# Patient Record
Sex: Male | Born: 1965 | Race: White | Hispanic: No | Smoking: Current every day smoker
Health system: Southern US, Community
[De-identification: ages and names within clinical notes are randomized; demographics above are authoritative.]

## PROBLEM LIST (undated history)

## (undated) DIAGNOSIS — E1139 Type 2 diabetes mellitus with other diabetic ophthalmic complication: Secondary | ICD-10-CM

## (undated) DIAGNOSIS — F1010 Alcohol abuse, uncomplicated: Secondary | ICD-10-CM

## (undated) DIAGNOSIS — H547 Unspecified visual loss: Secondary | ICD-10-CM

## (undated) DIAGNOSIS — E108 Type 1 diabetes mellitus with unspecified complications: Secondary | ICD-10-CM

## (undated) DIAGNOSIS — G629 Polyneuropathy, unspecified: Secondary | ICD-10-CM

## (undated) DIAGNOSIS — D72819 Decreased white blood cell count, unspecified: Secondary | ICD-10-CM

## (undated) DIAGNOSIS — M810 Age-related osteoporosis without current pathological fracture: Secondary | ICD-10-CM

## (undated) DIAGNOSIS — R531 Weakness: Secondary | ICD-10-CM

## (undated) HISTORY — PX: ORIF RADIAL HEAD / NECK FRACTURE: SUR956

---
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
## 2009-10-09 HISTORY — PX: EYE SURGERY: SHX253

## 2013-10-09 DIAGNOSIS — G629 Polyneuropathy, unspecified: Secondary | ICD-10-CM

## 2013-10-09 HISTORY — DX: Polyneuropathy, unspecified: G62.9

## 2015-09-02 ENCOUNTER — Encounter (HOSPITAL_BASED_OUTPATIENT_CLINIC_OR_DEPARTMENT_OTHER): Payer: Self-pay | Admitting: Emergency Medicine

## 2015-09-02 ENCOUNTER — Emergency Department (HOSPITAL_BASED_OUTPATIENT_CLINIC_OR_DEPARTMENT_OTHER): Payer: Self-pay

## 2015-09-02 ENCOUNTER — Emergency Department (HOSPITAL_BASED_OUTPATIENT_CLINIC_OR_DEPARTMENT_OTHER)
Admission: EM | Admit: 2015-09-02 | Discharge: 2015-09-02 | Disposition: A | Discharge status: Home / Self Care | Payer: Self-pay | Attending: Emergency Medicine | Admitting: Emergency Medicine

## 2015-09-02 DIAGNOSIS — Y9289 Other specified places as the place of occurrence of the external cause: Secondary | ICD-10-CM | POA: Insufficient documentation

## 2015-09-02 DIAGNOSIS — Z23 Encounter for immunization: Secondary | ICD-10-CM | POA: Insufficient documentation

## 2015-09-02 DIAGNOSIS — W228XXA Striking against or struck by other objects, initial encounter: Secondary | ICD-10-CM | POA: Insufficient documentation

## 2015-09-02 DIAGNOSIS — Z8659 Personal history of other mental and behavioral disorders: Secondary | ICD-10-CM | POA: Insufficient documentation

## 2015-09-02 DIAGNOSIS — F17200 Nicotine dependence, unspecified, uncomplicated: Secondary | ICD-10-CM | POA: Insufficient documentation

## 2015-09-02 DIAGNOSIS — Z794 Long term (current) use of insulin: Secondary | ICD-10-CM | POA: Insufficient documentation

## 2015-09-02 DIAGNOSIS — E119 Type 2 diabetes mellitus without complications: Secondary | ICD-10-CM | POA: Insufficient documentation

## 2015-09-02 DIAGNOSIS — Y9389 Activity, other specified: Secondary | ICD-10-CM | POA: Insufficient documentation

## 2015-09-02 DIAGNOSIS — S82122A Displaced fracture of lateral condyle of left tibia, initial encounter for closed fracture: Secondary | ICD-10-CM | POA: Insufficient documentation

## 2015-09-02 DIAGNOSIS — Y998 Other external cause status: Secondary | ICD-10-CM | POA: Insufficient documentation

## 2015-09-02 DIAGNOSIS — S82142A Displaced bicondylar fracture of left tibia, initial encounter for closed fracture: Secondary | ICD-10-CM

## 2015-09-02 HISTORY — DX: Alcohol abuse, uncomplicated: F10.10

## 2015-09-02 LAB — CBG MONITORING, ED
Glucose-Capillary: 18 mg/dL — CL (ref 65–99)
Glucose-Capillary: 160 mg/dL — ABNORMAL HIGH (ref 65–99)

## 2015-09-02 MED ORDER — TETANUS-DIPHTH-ACELL PERTUSSIS 5-2.5-18.5 LF-MCG/0.5 IM SUSP
0.5000 mL | Freq: Once | INTRAMUSCULAR | Status: AC
  Administered 2015-09-02: 0.5 mL via INTRAMUSCULAR
  Filled 2015-09-02: qty 0.5

## 2015-09-02 MED ORDER — OXYCODONE-ACETAMINOPHEN 5-325 MG PO TABS: 2.0000 | ORAL_TABLET | ORAL | Status: DC | PRN

## 2015-09-02 NOTE — ED Notes (Signed)
MD at bedside. 

## 2015-09-02 NOTE — ED Notes (Signed)
Pt requesting for tech to check bg, a/o x 4 and currently sitting up on side of bed in nad.  After check, requested for pt to turn off insulin pump, juice given and pt refused to do so.  States "itll be fine, i usually drop this low."  Informed of consequences of hypoglycemia, including death and pt still refusing.  Full meal offered and MD notified.  Will recheck.

## 2015-09-02 NOTE — ED Notes (Signed)
Patient stated he feels need to check blood glucose. I notified nurse Marva of request, i then took CBG and got result of 20 mg./dcltr. I notified nurse again, then rechecked with nurse present. I got 18 mg.;dcltr with second reading.

## 2015-09-02 NOTE — ED Notes (Signed)
During crutch training, I advised patient to use muscle in his hip to keep left leg out in front of him and thus avoid weight bearing. Patient stated he was unable to walk with crutches without bearing weight. Patient seemed unwilling to take my instruction. I notified patient of need for safety in crutch use.

## 2015-09-02 NOTE — Discharge Instructions (Signed)
° °Displaced Tibial Plateau Fracture °A tibial plateau fracture is a break in the bone that forms the bottom of your knee joint (tibia or shin bone). The lower end of your thigh bone (femur) forms the upper surface of your knee joint. The top of the tibia has a flat, smooth surface (tibial plateau). This part of the shin bone is made of softer bone than the shaft of the shin bone. If a strong force shoves your femur down into your tibial plateau, the tibial plateau can collapse or break away at the edges. This is also called an intra-articular fracture. °A displaced tibial plateau fracture means that one or more pieces of your tibial plateau have been moved out of normal position. Without treatment, this fracture can make your knee unstable. It can also lead to arthritis or difficulty walking. °CAUSES °Common causes of this type of fracture include: °· Car accidents. °· Jumps or falls from a significant height. °· Injuries from high-energy sports or contact sports. °RISK FACTORS °You may be at higher risk for this type of fracture if: °· You play high-energy sports or contact sports. °· You have a history of bone infections. °· You are an older person with a condition that causes weak bones (osteoporosis). °SIGNS AND SYMPTOMS °Signs and symptoms of a displaced tibial plateau fracture begin immediately after the injury. They may include: °· Pain that is worse when you use your knee to support your body weight. °· Swelling of your knee. °· Bruising around your knee. °Other symptoms may include: °· Your knee looking out of place (deformity). °· Your foot looking pale and feeling cool to the touch. °· Having a feeling of pins and needles around your foot. °DIAGNOSIS °A displaced tibial plateau fracture can be diagnosed with X-rays. Sometimes, a CT scan is done to help your health care provider: °· To identify how much the bone has moved out of place. °· To find any broken-off pieces of bone. °TREATMENT °Treatment for a  displaced tibial plateau fracture is surgery to put the pieces of broken bone back into place (open reduction). This surgery is usually done as soon as possible.  °While you are waiting for surgery, your health care provider may prescribe pain medicines. °HOME CARE INSTRUCTIONS °If you are sent home before surgery, follow these instructions: °Managing Pain, Stiffness, and Swelling °· If directed, apply ice to the injured area: °¨ Put ice in a plastic bag. °¨ Place a towel between your skin and the bag. °¨ Leave the ice on for 20 minutes, 2-3 times a day. °· Raise the injured area above the level of your heart while you are sitting or lying down. °Driving °· Do not drive or operate heavy machinery while you are taking pain medicine. °Activity °· Return to your normal activities as directed by your health care provider. Ask your health care provider what activities are safe for you. °· Perform range-of-motion exercises only as directed by your health care provider. °Safety °· Do not use your injured limb to support your body weight until your health care provider says that you can. Use crutches or a walker as directed by your health care provider. °General Instructions °· Do not use any tobacco products, including cigarettes, chewing tobacco, or electronic cigarettes. Tobacco can delay bone healing. If you need help quitting, ask your health care provider. °· Take medicines only as directed by your health care provider. °· Keep all follow-up visits as directed by your health care provider. This is   important. °SEEK MEDICAL CARE IF: °Any of these things happen while you are waiting for surgery: °· Your pain medicine is not helping. °· You develop chills or a fever. °SEEK IMMEDIATE MEDICAL CARE IF:  °Any of these things happen while you are waiting for surgery: °· You have severe pain or swelling. °· Your calf feels warm and is swollen and painful. °· You have chest pain. °· You have trouble breathing. °  °This  information is not intended to replace advice given to you by your health care provider. Make sure you discuss any questions you have with your health care provider. °  °Document Released: 06/17/2002 Document Revised: 10/16/2014 Document Reviewed: 05/13/2014 °Elsevier Interactive Patient Education ©2016 [employer] ° °

## 2015-09-02 NOTE — ED Notes (Signed)
Small abrasions on the back of head. Pt states "No I don't want it cleaned, just leave it I'm fine" RN discussed the importance of having it cleaned and his diabetic hx.

## 2015-09-02 NOTE — ED Provider Notes (Signed)
CSN: [id number]     Arrival date & time 09/02/15  1249 History   First MD Initiated Contact with Patient 09/02/15 1249     Chief Complaint  Patient presents with  . Fall      HPI  Patient complains of pain to his left knee and left lower leg. He was moving heavy containers off of a truck. He dropped 1. It landed to his left top of the box rolled back towards him and struck him just below his left knee with a lateral to medial valgus stress at his left knee.  He states he cannot walk. At rest he has minimal symptoms just and can move the knee. Have her weightbearing he has marked discomfort  Past Medical History  Diagnosis Date  . Diabetes mellitus without complication (HCC)   . ETOH abuse    No past surgical history on file. No family history on file. Social History  Substance Use Topics  . Smoking status: Current Every Day Smoker  . Smokeless tobacco: None  . Alcohol Use: Yes    Review of Systems  Musculoskeletal:       Pain to the left knee and lower leg. No other areas of pain or injury. No additional symptoms   review systems otherwise negative    Allergies  Review of patient's allergies indicates no known allergies.  Home Medications   Prior to Admission medications   Medication Sig Start Date End Date Taking? Authorizing Provider  insulin lispro (HUMALOG) 100 UNIT/ML injection Inject into the skin.   Yes Historical Provider, MD  oxyCODONE-acetaminophen (PERCOCET/ROXICET) 5-325 MG tablet Take 2 tablets by mouth every 4 (four) hours as needed. 09/02/15   Rolland PorterMark Jenesys Casseus, MD   BP 122/79 mmHg  Pulse 95  Temp(Src) 98.9 F (37.2 C) (Oral)  Resp 16  Wt 136 lb (61.689 kg)  SpO2 100% Physical Exam  Musculoskeletal:  No pain at the hip or ankle. He can flex the knee to approximately 90 although with some pain. Tenderness directly over the lateral joint line and fibular head. No palpable joint effusion.    ED Course  Procedures (including critical care time) Labs  Review Labs Reviewed - No data to display  Imaging Review Dg Tibia/fibula Left  09/02/2015  CLINICAL DATA:  Injury EXAM: LEFT TIBIA AND FIBULA - 2 VIEW COMPARISON:  None. FINDINGS: There is a lipohemarthrosis within the knee joint compatible with intra-articular fracture. There is a subtle fracture along the lateral lip of the tibial plateau worrisome for intra-articular fracture. Femur and patella are grossly intact. IMPRESSION: Findings are worrisome for an acute lateral tibial plateau fracture. CT of the knee is recommended. Electronically Signed   By: Jolaine ClickArthur  Hoss M.D.   On: 09/02/2015 13:11   Ct Knee Left Wo Contrast  09/02/2015  CLINICAL DATA:  Lateral knee pain, tenderness and limited weight-bearing after falling from a truck. Evaluate proximal tibial fracture. EXAM: CT OF THE LEFT KNEE WITHOUT CONTRAST TECHNIQUE: Multidetector CT imaging of the left knee was performed according to the standard protocol. Multiplanar CT image reconstructions were also generated. COMPARISON:  Radiograph same date. FINDINGS: CT confirms the presence of an acute fracture of the proximal tibia. There is mild depression of the articular surface of the lateral tibial plateau anteriorly by 2-3 mm. There is peripheral extension of a nondisplaced fracture into the anterior, lateral and posterior cortex of the lateral tibial metaphysis. There is also central extension of a nondisplaced fracture between the tibial spines. Well corticated 10  mm ossific density medial to the tibial insertion of the PCL appears well corticated, probably a chronic central loose body. The distal femur, proximal fibula and patella are intact. As evaluated by CT, the extensor mechanism and cruciate ligaments are intact. There is a moderate lipohemarthrosis. 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 IMPRESSION: 1. Intra-articular fracture of the proximal tibia as described with mild depression of the articular surface of the  lateral tibial plateau anteriorly. Fracture extends between the tibial spines which appear intact. 2. Lipohemarthrosis. 3. Suspected central loose body. Electronically Signed   By: Carey Bullocks M.D.   On: 09/02/2015 14:01   I have personally reviewed and evaluated these images and lab results as part of my medical decision-making.   EKG Interpretation None      MDM   Final diagnoses:  Tibial plateau fracture, left, closed, initial encounter    Mechanism symptoms and exam concerning for plateau fracture. Plain film x-rays show joint effusion and subtle abnormality adjacent to plateau. CT scan confirms minimally displaced lateral plateau fracture. Discussed at length with him. He just finished a meal. He is appropriate for outpatient treatment with orthopedic follow-up to discuss surgical versus nonsurgical treatment. Placed in a knee immobilizer and crutches. Instructed to be strictly nonweightbearing.    Rolland Porter, MD 09/02/15 [phone]

## 2015-09-02 NOTE — ED Notes (Signed)
Pt

## 2015-09-02 NOTE — ED Notes (Signed)
Pt given instructions about discharge and pain mgt and use of ice. Pt continues to say "Naw I don't need that."

## 2015-09-02 NOTE — ED Notes (Signed)
Patient transported to X-ray 

## 2015-09-06 LAB — CBG MONITORING, ED: Glucose-Capillary: 20 mg/dL — CL (ref 65–99)

## 2015-10-15 MED FILL — NovoLOG 100 UNIT/ML SOLN: 100 | 33 days supply | Qty: 10 | Fill #1

## 2015-10-27 MED FILL — CONTOUR NEXT STRIPS: 38 days supply | Qty: 150 | Fill #1

## 2015-11-16 MED FILL — NovoLOG 100 UNIT/ML SOLN: 100 | 33 days supply | Qty: 10 | Fill #2

## 2015-11-29 MED FILL — CONTOUR NEXT STRIPS: 38 days supply | Qty: 150 | Fill #2

## 2015-12-14 MED FILL — HumaLOG 100 UNIT/ML SOLN: 100 | 33 days supply | Qty: 10 | Fill #0

## 2016-01-05 MED FILL — HumaLOG 100 UNIT/ML SOLN: 100 | 33 days supply | Qty: 10 | Fill #1

## 2016-01-05 MED FILL — CONTOUR NEXT STRIPS: 38 days supply | Qty: 150 | Fill #3

## 2016-02-02 MED FILL — CONTOUR NEXT STRIPS: 38 days supply | Qty: 150 | Fill #4

## 2016-02-16 MED FILL — PENICILLIN VK 500 MG TABLET: 500 | 7 days supply | Qty: 29 | Fill #0

## 2016-02-16 MED FILL — CHLORHEXIDINE 0.12% RINSE: 0.12 | 16 days supply | Qty: 473 | Fill #0

## 2016-02-16 MED FILL — ACETAMINOPHEN/COD #3 TABLET: 300-30 | 3 days supply | Qty: 20 | Fill #0

## 2016-02-16 MED FILL — IBUPROFEN 600 MG TABLET: 600 | 5 days supply | Qty: 20 | Fill #0

## 2016-02-28 MED FILL — HumaLOG 100 UNIT/ML SOLN: 100 | 33 days supply | Qty: 10 | Fill #2

## 2016-03-27 MED FILL — HumaLOG 100 UNIT/ML SOLN: 100 | 33 days supply | Qty: 10 | Fill #3

## 2016-04-03 MED FILL — CONTOUR NEXT STRIPS: 38 days supply | Qty: 150 | Fill #0

## 2016-05-03 MED FILL — HumaLOG 100 UNIT/ML SOLN: 100 | 33 days supply | Qty: 10 | Fill #4

## 2016-05-04 MED FILL — CONTOUR NEXT STRIPS: 37 days supply | Qty: 150 | Fill #0

## 2016-05-26 MED FILL — CONTOUR NEXT STRIPS: 37 days supply | Qty: 150 | Fill #1

## 2016-06-13 MED FILL — HumaLOG 100 UNIT/ML SOLN: 100 | 33 days supply | Qty: 10 | Fill #5

## 2016-06-13 MED FILL — CONTOUR NEXT STRIPS: 37 days supply | Qty: 150 | Fill #2

## 2016-06-29 ENCOUNTER — Encounter (HOSPITAL_BASED_OUTPATIENT_CLINIC_OR_DEPARTMENT_OTHER): Payer: Self-pay | Admitting: Emergency Medicine

## 2016-06-29 ENCOUNTER — Emergency Department (HOSPITAL_BASED_OUTPATIENT_CLINIC_OR_DEPARTMENT_OTHER): Payer: Self-pay

## 2016-06-29 ENCOUNTER — Emergency Department (HOSPITAL_BASED_OUTPATIENT_CLINIC_OR_DEPARTMENT_OTHER)
Admission: EM | Admit: 2016-06-29 | Discharge: 2016-06-29 | Disposition: A | Discharge status: Home / Self Care | Payer: Self-pay | Attending: Emergency Medicine | Admitting: Emergency Medicine

## 2016-06-29 DIAGNOSIS — R7401 Elevation of levels of liver transaminase levels: Secondary | ICD-10-CM

## 2016-06-29 DIAGNOSIS — E119 Type 2 diabetes mellitus without complications: Secondary | ICD-10-CM | POA: Insufficient documentation

## 2016-06-29 DIAGNOSIS — R740 Nonspecific elevation of levels of transaminase and lactic acid dehydrogenase [LDH]: Secondary | ICD-10-CM | POA: Insufficient documentation

## 2016-06-29 DIAGNOSIS — F17210 Nicotine dependence, cigarettes, uncomplicated: Secondary | ICD-10-CM | POA: Insufficient documentation

## 2016-06-29 DIAGNOSIS — Z794 Long term (current) use of insulin: Secondary | ICD-10-CM | POA: Insufficient documentation

## 2016-06-29 DIAGNOSIS — J189 Pneumonia, unspecified organism: Secondary | ICD-10-CM | POA: Insufficient documentation

## 2016-06-29 LAB — CBC WITH DIFFERENTIAL/PLATELET
BASOS ABS: 0 10*3/uL (ref 0.0–0.1)
BASOS PCT: 0 %
EOS PCT: 2 %
Eosinophils Absolute: 0.1 10*3/uL (ref 0.0–0.7)
HCT: 33.3 % — ABNORMAL LOW (ref 39.0–52.0)
HEMOGLOBIN: 12 g/dL — AB (ref 13.0–17.0)
LYMPHS ABS: 0.4 10*3/uL — AB (ref 0.7–4.0)
LYMPHS PCT: 16 %
MCH: 33.1 pg (ref 26.0–34.0)
MCHC: 36 g/dL (ref 30.0–36.0)
MCV: 91.7 fL (ref 78.0–100.0)
MONOS PCT: 8 %
Monocytes Absolute: 0.2 10*3/uL (ref 0.1–1.0)
Neutro Abs: 1.9 10*3/uL (ref 1.7–7.7)
Neutrophils Relative %: 74 %
PLATELETS: 81 10*3/uL — AB (ref 150–400)
RBC: 3.63 MIL/uL — AB (ref 4.22–5.81)
RDW: 14.8 % (ref 11.5–15.5)
WBC: 2.6 10*3/uL — ABNORMAL LOW (ref 4.0–10.5)

## 2016-06-29 LAB — COMPREHENSIVE METABOLIC PANEL
ALK PHOS: 283 U/L — AB (ref 38–126)
ALT: 132 U/L — AB (ref 17–63)
AST: 397 U/L — AB (ref 15–41)
Albumin: 2.6 g/dL — ABNORMAL LOW (ref 3.5–5.0)
Anion gap: 10 (ref 5–15)
BUN: 5 mg/dL — AB (ref 6–20)
CALCIUM: 8.1 mg/dL — AB (ref 8.9–10.3)
CHLORIDE: 96 mmol/L — AB (ref 101–111)
CO2: 23 mmol/L (ref 22–32)
CREATININE: 0.6 mg/dL — AB (ref 0.61–1.24)
GFR calc non Af Amer: >60 mL/min (ref >60)
Glucose, Bld: 92 mg/dL (ref 65–99)
Potassium: 4.3 mmol/L (ref 3.5–5.1)
SODIUM: 129 mmol/L — AB (ref 135–145)
Total Bilirubin: 1.1 mg/dL (ref 0.3–1.2)
Total Protein: 6.1 g/dL — ABNORMAL LOW (ref 6.5–8.1)

## 2016-06-29 LAB — BRAIN NATRIURETIC PEPTIDE: B Natriuretic Peptide: 66.4 pg/mL (ref 0.0–100.0)

## 2016-06-29 LAB — LIPASE, BLOOD: LIPASE: 32 U/L (ref 11–51)

## 2016-06-29 LAB — D-DIMER, QUANTITATIVE (NOT AT ARMC): D DIMER QUANT: 1.65 ug{FEU}/mL — AB (ref 0.00–0.50)

## 2016-06-29 LAB — TROPONIN I: TROPONIN I: 0.03 ng/mL — AB (ref <0.03)

## 2016-06-29 MED ORDER — IOPAMIDOL (ISOVUE-370) INJECTION 76%
100.0000 mL | Freq: Once | INTRAVENOUS | Status: AC | PRN
  Administered 2016-06-29: 100 mL via INTRAVENOUS

## 2016-06-29 MED ORDER — DEXTROSE 5 % IV SOLN
1.0000 g | Freq: Once | INTRAVENOUS | Status: AC
  Administered 2016-06-29: 1 g via INTRAVENOUS
  Filled 2016-06-29: qty 10

## 2016-06-29 MED ORDER — DOXYCYCLINE HYCLATE 100 MG PO CAPS: 100.0000 mg | ORAL_CAPSULE | Freq: Two times a day (BID) | ORAL | 0 refills | Status: DC

## 2016-06-29 MED ORDER — AZITHROMYCIN 250 MG PO TABS: 500.0000 mg | ORAL_TABLET | Freq: Once | ORAL | Status: DC

## 2016-06-29 MED ORDER — AMOXICILLIN-POT CLAVULANATE ER 1000-62.5 MG PO TB12: 2.0000 | ORAL_TABLET | Freq: Two times a day (BID) | ORAL | 0 refills | Status: DC

## 2016-06-29 MED ORDER — IPRATROPIUM-ALBUTEROL 0.5-2.5 (3) MG/3ML IN SOLN
3.0000 mL | Freq: Once | RESPIRATORY_TRACT | Status: AC
  Administered 2016-06-29: 3 mL via RESPIRATORY_TRACT
  Filled 2016-06-29: qty 3

## 2016-06-29 MED FILL — CONTOUR NEXT STRIPS: 38 days supply | Qty: 150 | Fill #3

## 2016-06-29 MED FILL — AMOXICILLIN-CLAV ER 1,000-6: 1000-62.5 | 7 days supply | Qty: 28 | Fill #0

## 2016-06-29 MED FILL — DOXYCYCLINE HYC 100 MG CAP: 100 | 7 days supply | Qty: 14 | Fill #0

## 2016-06-29 NOTE — ED Notes (Signed)
Pt returns from radiology, placed back on cont cardiac monitoring with cont POX, pt stood to void (voided 26400ml's into urinal), became very SOB, resp increased to 32/min, HR increased to 140/min, placed back on stretcher, positioned for comfort, semi-fowlers position, POX 100% on RA, HR now at 106/min, RR at 28/min

## 2016-06-29 NOTE — ED Triage Notes (Signed)
Pt presents with sob and weakness for a month but reports it has gotten worse Monday. Denies chest pain or fever but n/v/d at least 1-2 times daily. VSS at triage and ambulatory.

## 2016-06-29 NOTE — ED Provider Notes (Signed)
[facility]   CSN: [id number] Arrival date & time: 06/29/16  1120     History   Chief Complaint Chief Complaint  Patient presents with  . Shortness of Breath  . Weakness    HPI Jorge Baker is a 50 y.o. male.  The history is provided by the patient. No language interpreter was used.  Shortness of Breath   Weakness  Associated symptoms include shortness of breath.    Jorge Baker is a 50 y.o. male who presents to the Emergency Department complaining of SOB.  He reports 1 month of progressive shortness of breath and dyspnea on exertion. He has associated lower extremity weakness. He has a cough productive of sputum. Symptoms are moderate to severe, constant, worsening. He denies any chest pain, fevers, leg swelling or pain. He has a history of insulin-dependent diabetes since the age of 2-1/2. He denies any history of cardiac disease or blood clots. He drinks a sixpack of beer daily if his blood sugars will allow him to. Last November he developed a rash to his face, chest, back that is itchy in nature. He has not been evaluated for this rash is gradually worsening.  Past Medical History:  Diagnosis Date  . Diabetes mellitus without complication (HCC)   . ETOH abuse     There are no active problems to display for this patient.   Past Surgical History:  Procedure Laterality Date  . EYE SURGERY         Home Medications    Prior to Admission medications   Medication Sig Start Date End Date Taking? Authorizing Provider  insulin lispro (HUMALOG) 100 UNIT/ML injection Inject into the skin.   Yes Historical Provider, MD  amoxicillin-clavulanate (AUGMENTIN XR) 1000-62.5 MG 12 hr tablet Take 2 tablets by mouth 2 (two) times daily. 06/29/16   Tilden Fossa, MD  doxycycline (VIBRAMYCIN) 100 MG capsule Take 1 capsule (100 mg total) by mouth 2 (two) times daily. 06/29/16   Tilden Fossa, MD  oxyCODONE-acetaminophen (PERCOCET/ROXICET) 5-325 MG  tablet Take 2 tablets by mouth every 4 (four) hours as needed. 09/02/15   Rolland Porter, MD    Family History No family history on file.  Social History Social History  Substance Use Topics  . Smoking status: Current Every Day Smoker    Packs/day: 1.00    Types: Cigarettes  . Smokeless tobacco: Never Used  . Alcohol use Yes     Allergies   Review of patient's allergies indicates no known allergies.   Review of Systems Review of Systems  Respiratory: Positive for shortness of breath.   Neurological: Positive for weakness.  All other systems reviewed and are negative.    Physical Exam Updated Vital Signs BP 103/64   Pulse 106   Temp 98.9 F (37.2 C) (Oral)   Resp (!) 27   Ht 5\' 4"  (1.626 m)   Wt 124 lb (56.2 kg)   SpO2 96%   BMI 21.28 kg/m   Physical Exam  Constitutional: He is oriented to person, place, and time. He appears well-developed.  Chronically ill-appearing  HENT:  Head: Normocephalic and atraumatic.  Cardiovascular: Regular rhythm.   No murmur heard. Tachycardic  Pulmonary/Chest: Effort normal. No respiratory distress.  Bibasilar crackles, greatest in the right lung base.  Abdominal: Soft. There is no rebound and no guarding.  Insulin pump in the abdominal wall. Mild epigastric tenderness  Musculoskeletal: He exhibits no tenderness.  1+ pitting edema to bilateral lower extremities  Neurological: He is  alert and oriented to person, place, and time.  Skin: Skin is warm and dry.  Erythematous rash to head, neck, upper back with white crusting and scaling  Psychiatric: He has a normal mood and affect. His behavior is normal.  Nursing note and vitals reviewed.    ED Treatments / Results  Labs (all labs ordered are listed, but only abnormal results are displayed) Labs Reviewed  COMPREHENSIVE METABOLIC PANEL - Abnormal; Notable for the following:       Result Value   Sodium 129 (*)    Chloride 96 (*)    BUN 5 (*)    Creatinine, Ser 0.60 (*)     Calcium 8.1 (*)    Total Protein 6.1 (*)    Albumin 2.6 (*)    AST 397 (*)    ALT 132 (*)    Alkaline Phosphatase 283 (*)    All other components within normal limits  TROPONIN I - Abnormal; Notable for the following:    Troponin I 0.03 (*)    All other components within normal limits  CBC WITH DIFFERENTIAL/PLATELET - Abnormal; Notable for the following:    WBC 2.6 (*)    RBC 3.63 (*)    Hemoglobin 12.0 (*)    HCT 33.3 (*)    Platelets 81 (*)    Lymphs Abs 0.4 (*)    All other components within normal limits  D-DIMER, QUANTITATIVE (NOT AT [facility]) - Abnormal; Notable for the following:    D-Dimer, Quant 1.65 (*)    All other components within normal limits  BRAIN NATRIURETIC PEPTIDE  LIPASE, BLOOD    EKG  EKG Interpretation  Date/Time:  Thursday June 29 2016 12:31:08 EDT Ventricular Rate:  95 PR Interval:    QRS Duration: 80 QT Interval:  324 QTC Calculation: 408 R Axis:   82 Text Interpretation:  Sinus rhythm Probable left atrial enlargement Low voltage with right axis deviation ST elev, probable normal early repol pattern Baseline wander Confirmed by Lincoln Brighamees, Liz [phone]([id number]) on 06/29/2016 12:57:09 PM       Radiology Dg Chest 2 View  Result Date: 06/29/2016 CLINICAL DATA:  Cough and dyspnea for 1 month EXAM: CHEST  2 VIEW COMPARISON:  None. FINDINGS: The heart size and mediastinal contours are within normal limits. The lungs are hyperinflated without evidence of CHF or pneumonic consolidation. There is attenuation of interstitial and pulmonary vasculature. The visualized skeletal structures are unremarkable. IMPRESSION: No active cardiopulmonary disease.  Hyperinflated lungs. Electronically Signed   By: Tollie Ethavid  Kwon M.D.   On: 06/29/2016 12:25   Ct Angio Chest Pe W/cm &/or Wo Cm  Result Date: 06/29/2016 CLINICAL DATA:  Intermittent shortness of breath for 1 month, constant for the past 3 days. EXAM: CT ANGIOGRAPHY CHEST WITH CONTRAST TECHNIQUE: Multidetector CT imaging of the  chest was performed using the standard protocol during bolus administration of intravenous contrast. Multiplanar CT image reconstructions and MIPs were obtained to evaluate the vascular anatomy. CONTRAST:  100 mL Isovue 370 COMPARISON:  Chest radiographs 06/29/2016 FINDINGS: Cardiovascular: Satisfactory opacification of the pulmonary arteries to the segmental level. No evidence of pulmonary embolism. Normal heart size. No pericardial effusion. Normal caliber of the thoracic aorta. Mediastinum/Nodes: Subcarinal lymph nodes measure up to 1.3 cm in short axis, nonspecific. No enlarged axillary or hilar lymph nodes. Unremarkable thyroid. Lungs/Pleura: No pleural effusion or pneumothorax. Evaluation of the lung parenchyma is mildly limited by respiratory motion artifact. Minimal subpleural opacities dependently in both lower lobes likely reflect atelectasis. Additionally, there  is asymmetric patchy opacity in the basilar right lower lobe. Faint subpleural ground-glass nodular opacities are also suspected in the upper lobes, left greater than right an with some areas suggestive of tree-in-bud configuration. (For example series 8, image 76). Upper Abdomen: Diffusely decreased attenuation of the liver consistent with steatosis. Musculoskeletal: Thoracic spondylosis with scattered Schmorl's nodes. Review of the MIP images confirms the above findings. IMPRESSION: 1. No evidence of pulmonary emboli. 2. Mild right lower lobe opacity which may reflect atelectasis or pneumonia. 3. Faint peripheral ground-glass nodularity which may reflect bronchiolitis. Evaluation limited by motion artifact. Electronically Signed   By: Sebastian Ache M.D.   On: 06/29/2016 15:30   US Abdomen Limited Ruq  Result Date: 06/29/2016 CLINICAL DATA:  Right upper quadrant pain, increased transaminases EXAM: US ABDOMEN LIMITED - RIGHT UPPER QUADRANT COMPARISON:  None. FINDINGS: Gallbladder: No gallstones or wall thickening visualized. No sonographic  Murphy sign noted by sonographer. Common bile duct: Diameter: 4.2 mm in diameter within normal limits. Liver: No focal lesion identified. There is diffuse increased echogenicity of the liver consistent with fatty infiltration. IMPRESSION: No gallstones are noted within gallbladder. Normal CBD. Diffuse increased echogenicity of the liver consistent with fatty infiltration. Electronically Signed   By: Natasha Mead M.D.   On: 06/29/2016 15:41    Procedures Procedures (including critical care time)  Medications Ordered in ED Medications  ipratropium-albuterol (DUONEB) 0.5-2.5 (3) MG/3ML nebulizer solution 3 mL (3 mLs Nebulization Given 06/29/16 1403)  iopamidol (ISOVUE-370) 76 % injection 100 mL (100 mLs Intravenous Contrast Given 06/29/16 1454)  cefTRIAXone (ROCEPHIN) 1 g in dextrose 5 % 50 mL IVPB (0 g Intravenous Stopped 06/29/16 1736)     Initial Impression / Assessment and Plan / ED Course  I have reviewed the triage vital signs and the nursing notes.  Pertinent labs & imaging results that were available during my care of the patient were reviewed by me and considered in my medical decision making (see chart for details).  Clinical Course    Patient with history of diabetes and alcohol abuse here for progressive weakness and cough. He is chronically ill appearing on examination. Initial examination concerning for congestive heart failure versus pneumonia. Initial chest x-ray is clear. CT PE study was obtained given his symptoms and elevated d-dimer. Study was negative for PE but did demonstrate a right lower lobe pneumonia that is consistent with his examination. He was treated with antibiotics with recommendation for admission given his tachycardia, tachypnea and multiple comorbidities. Patient refused admission to the hospital. Discussed with patient concern for worsening illness as well as worsening respiratory status, potential injury to his heart and lungs. Patient understood the  recommendations and continues to refuse admission. His mother was present for conversation. Plan to administer antibiotics prior to discharge and will discharge home on Augmentin and doxycycline for community acquired pneumonia. Also discussed with patient multiple lab abnormalities that will need prompt follow-up.  Final Clinical Impressions(s) / ED Diagnoses   Final diagnoses:  Transaminitis  CAP (community acquired pneumonia)    New Prescriptions Discharge Medication List as of 06/29/2016  4:28 PM    START taking these medications   Details  amoxicillin-clavulanate (AUGMENTIN XR) 1000-62.5 MG 12 hr tablet Take 2 tablets by mouth 2 (two) times daily., Starting Thu 06/29/2016, Print    doxycycline (VIBRAMYCIN) 100 MG capsule Take 1 capsule (100 mg total) by mouth 2 (two) times daily., Starting Thu 06/29/2016, Print         Tilden Fossa, MD 06/30/16  0704  

## 2016-06-29 NOTE — ED Notes (Signed)
Pt states has had SOB with exertion or doing activity for the past month, pt states has become worse in the past week, states feels better after sitting down and resting for approx 10 min, denies any PND or orthopnea. States having prod cough, white in color. Denies any chest pain or any swelling to be noted with episodes. States has had a poor appetite as well.

## 2016-06-29 NOTE — Discharge Instructions (Signed)
You have multiple issues that need to be followed up closely by your family doctor.  Your heart enzyme and liver enzymes were elevated.  Your sodium and blood counts are low.  Try to decrease how much alcohol you are drinking, it is harming your liver and possibly your heart and blood cells.  Get rechecked immediately.

## 2016-06-29 NOTE — ED Notes (Signed)
Rocephin completed. Pt states he is ready to leave. Family at bedside to drive.

## 2016-06-29 NOTE — ED Notes (Signed)
CXR completed as ordered, pt placed on cont cardiac monitoring with int NBP monitoring and cont POX montioring. Safety measures in place. Plan of care explained to patient

## 2016-06-29 NOTE — ED Notes (Signed)
Pt off monitor to radiology/ CT scan, pt on stretcher, sr x 4 up

## 2016-06-29 NOTE — ED Notes (Signed)
Family at bedside. 

## 2016-06-29 NOTE — ED Notes (Signed)
Patient transported to X-ray 

## 2016-06-29 NOTE — ED Notes (Signed)
Insulin pump present at triage. Pt asked to get into gown for examination.

## 2016-07-17 MED FILL — HumaLOG 100 UNIT/ML SOLN: 100 | 33 days supply | Qty: 10 | Fill #0

## 2016-07-17 MED FILL — CONTOUR NEXT STRIPS: 38 days supply | Qty: 150 | Fill #4

## 2016-08-03 MED FILL — CONTOUR NEXT STRIPS: 38 days supply | Qty: 150 | Fill #5

## 2016-08-09 DIAGNOSIS — D72819 Decreased white blood cell count, unspecified: Secondary | ICD-10-CM

## 2016-08-09 HISTORY — DX: Decreased white blood cell count, unspecified: D72.819

## 2016-08-14 MED FILL — IVERMECTIN 3 MG TABLET: 3 | 7 days supply | Qty: 8 | Fill #0

## 2016-08-22 MED FILL — HumaLOG 100 UNIT/ML SOLN: 100 | 34 days supply | Qty: 10 | Fill #0

## 2016-08-22 MED FILL — CONTOUR NEXT STRIPS: 38 days supply | Qty: 150 | Fill #0

## 2016-08-29 MED FILL — metroNIDAZOLE 500 MG TABS: 500 | 10 days supply | Qty: 30 | Fill #0

## 2016-08-30 MED FILL — NovoLOG 100 UNIT/ML SOLN: 100 | 36 days supply | Qty: 10 | Fill #0

## 2016-09-22 MED FILL — CONTOUR NEXT STRIPS: 38 days supply | Qty: 150 | Fill #1

## 2016-10-04 MED FILL — TRIAMCINOLONE 0.1% OINTMENT: 0.1 | 30 days supply | Qty: 80 | Fill #0

## 2016-10-19 MED FILL — CONTOUR NEXT STRIPS: 38 days supply | Qty: 150 | Fill #2

## 2016-11-09 MED FILL — ACCU-CHEK GUIDE TEST STRIP: 50 days supply | Qty: 200 | Fill #0

## 2016-11-09 MED FILL — ACCU-CHEK FASTCLIX LANCETS: 51 days supply | Qty: 204 | Fill #0

## 2016-11-09 MED FILL — ACCU-CHEK GUIDE MONITOR SYS: W/DEVICE | 30 days supply | Qty: 1 | Fill #0

## 2016-11-12 ENCOUNTER — Inpatient Hospital Stay (HOSPITAL_BASED_OUTPATIENT_CLINIC_OR_DEPARTMENT_OTHER)
Admission: EM | Admit: 2016-11-12 | Discharge: 2016-11-22 | DRG: 812 | Disposition: A | Discharge status: Skilled Nursing Facility | Payer: Medicare HMO | Attending: Nephrology | Admitting: Nephrology

## 2016-11-12 ENCOUNTER — Emergency Department (HOSPITAL_BASED_OUTPATIENT_CLINIC_OR_DEPARTMENT_OTHER): Payer: Medicare HMO

## 2016-11-12 ENCOUNTER — Observation Stay (HOSPITAL_COMMUNITY)
Admission: AD | Admit: 2016-11-12 | Payer: Self-pay | Source: Other Acute Inpatient Hospital | Admitting: Family Medicine

## 2016-11-12 ENCOUNTER — Encounter (HOSPITAL_BASED_OUTPATIENT_CLINIC_OR_DEPARTMENT_OTHER): Payer: Self-pay | Admitting: Emergency Medicine

## 2016-11-12 DIAGNOSIS — K921 Melena: Secondary | ICD-10-CM | POA: Diagnosis present

## 2016-11-12 DIAGNOSIS — K649 Unspecified hemorrhoids: Secondary | ICD-10-CM | POA: Diagnosis present

## 2016-11-12 DIAGNOSIS — K648 Other hemorrhoids: Secondary | ICD-10-CM | POA: Diagnosis present

## 2016-11-12 DIAGNOSIS — E876 Hypokalemia: Secondary | ICD-10-CM | POA: Diagnosis not present

## 2016-11-12 DIAGNOSIS — E7220 Disorder of urea cycle metabolism, unspecified: Secondary | ICD-10-CM | POA: Diagnosis present

## 2016-11-12 DIAGNOSIS — D6959 Other secondary thrombocytopenia: Secondary | ICD-10-CM | POA: Diagnosis present

## 2016-11-12 DIAGNOSIS — M25561 Pain in right knee: Secondary | ICD-10-CM | POA: Diagnosis present

## 2016-11-12 DIAGNOSIS — D696 Thrombocytopenia, unspecified: Secondary | ICD-10-CM | POA: Diagnosis present

## 2016-11-12 DIAGNOSIS — R531 Weakness: Secondary | ICD-10-CM | POA: Diagnosis not present

## 2016-11-12 DIAGNOSIS — E118 Type 2 diabetes mellitus with unspecified complications: Secondary | ICD-10-CM

## 2016-11-12 DIAGNOSIS — K2970 Gastritis, unspecified, without bleeding: Secondary | ICD-10-CM | POA: Diagnosis present

## 2016-11-12 DIAGNOSIS — M810 Age-related osteoporosis without current pathological fracture: Secondary | ICD-10-CM | POA: Diagnosis present

## 2016-11-12 DIAGNOSIS — E10649 Type 1 diabetes mellitus with hypoglycemia without coma: Secondary | ICD-10-CM | POA: Diagnosis present

## 2016-11-12 DIAGNOSIS — D62 Acute posthemorrhagic anemia: Secondary | ICD-10-CM | POA: Diagnosis not present

## 2016-11-12 DIAGNOSIS — K922 Gastrointestinal hemorrhage, unspecified: Secondary | ICD-10-CM | POA: Diagnosis present

## 2016-11-12 DIAGNOSIS — Z794 Long term (current) use of insulin: Secondary | ICD-10-CM

## 2016-11-12 DIAGNOSIS — F17210 Nicotine dependence, cigarettes, uncomplicated: Secondary | ICD-10-CM | POA: Diagnosis present

## 2016-11-12 DIAGNOSIS — F10239 Alcohol dependence with withdrawal, unspecified: Secondary | ICD-10-CM | POA: Diagnosis not present

## 2016-11-12 DIAGNOSIS — D649 Anemia, unspecified: Secondary | ICD-10-CM | POA: Diagnosis not present

## 2016-11-12 DIAGNOSIS — E1069 Type 1 diabetes mellitus with other specified complication: Secondary | ICD-10-CM | POA: Diagnosis present

## 2016-11-12 DIAGNOSIS — R000 Tachycardia, unspecified: Secondary | ICD-10-CM

## 2016-11-12 DIAGNOSIS — F1010 Alcohol abuse, uncomplicated: Secondary | ICD-10-CM | POA: Diagnosis present

## 2016-11-12 DIAGNOSIS — E1065 Type 1 diabetes mellitus with hyperglycemia: Secondary | ICD-10-CM | POA: Diagnosis not present

## 2016-11-12 DIAGNOSIS — K644 Residual hemorrhoidal skin tags: Secondary | ICD-10-CM | POA: Diagnosis present

## 2016-11-12 DIAGNOSIS — N5089 Other specified disorders of the male genital organs: Secondary | ICD-10-CM | POA: Clinically undetermined

## 2016-11-12 DIAGNOSIS — M25569 Pain in unspecified knee: Secondary | ICD-10-CM

## 2016-11-12 DIAGNOSIS — K219 Gastro-esophageal reflux disease without esophagitis: Secondary | ICD-10-CM | POA: Diagnosis present

## 2016-11-12 DIAGNOSIS — H539 Unspecified visual disturbance: Secondary | ICD-10-CM | POA: Diagnosis present

## 2016-11-12 DIAGNOSIS — K7011 Alcoholic hepatitis with ascites: Secondary | ICD-10-CM | POA: Diagnosis present

## 2016-11-12 DIAGNOSIS — E119 Type 2 diabetes mellitus without complications: Secondary | ICD-10-CM

## 2016-11-12 DIAGNOSIS — R140 Abdominal distension (gaseous): Secondary | ICD-10-CM

## 2016-11-12 DIAGNOSIS — K2990 Gastroduodenitis, unspecified, without bleeding: Secondary | ICD-10-CM

## 2016-11-12 DIAGNOSIS — F329 Major depressive disorder, single episode, unspecified: Secondary | ICD-10-CM | POA: Diagnosis present

## 2016-11-12 HISTORY — DX: Unspecified visual loss: H54.7

## 2016-11-12 HISTORY — DX: Age-related osteoporosis without current pathological fracture: M81.0

## 2016-11-12 HISTORY — DX: Type 1 diabetes mellitus with unspecified complications: E10.8

## 2016-11-12 HISTORY — DX: Polyneuropathy, unspecified: G62.9

## 2016-11-12 HISTORY — DX: Decreased white blood cell count, unspecified: D72.819

## 2016-11-12 HISTORY — DX: Type 2 diabetes mellitus with other diabetic ophthalmic complication: E11.39

## 2016-11-12 LAB — COMPREHENSIVE METABOLIC PANEL
ALT: 68 U/L — ABNORMAL HIGH (ref 17–63)
ANION GAP: 15 (ref 5–15)
AST: 181 U/L — AB (ref 15–41)
Albumin: 2.2 g/dL — ABNORMAL LOW (ref 3.5–5.0)
Alkaline Phosphatase: 306 U/L — ABNORMAL HIGH (ref 38–126)
BUN: 20 mg/dL (ref 6–20)
CHLORIDE: 99 mmol/L — AB (ref 101–111)
CO2: 19 mmol/L — ABNORMAL LOW (ref 22–32)
Calcium: 7.9 mg/dL — ABNORMAL LOW (ref 8.9–10.3)
Creatinine, Ser: 0.53 mg/dL — ABNORMAL LOW (ref 0.61–1.24)
GFR calc Af Amer: >60 mL/min (ref >60)
Glucose, Bld: 91 mg/dL (ref 65–99)
POTASSIUM: 4.3 mmol/L (ref 3.5–5.1)
Sodium: 133 mmol/L — ABNORMAL LOW (ref 135–145)
TOTAL PROTEIN: 5.6 g/dL — AB (ref 6.5–8.1)
Total Bilirubin: 1 mg/dL (ref 0.3–1.2)

## 2016-11-12 LAB — RETICULOCYTES
RBC.: 2.27 MIL/uL — AB (ref 4.22–5.81)
RETIC COUNT ABSOLUTE: 43.1 10*3/uL (ref 19.0–186.0)
RETIC CT PCT: 1.9 % (ref 0.4–3.1)

## 2016-11-12 LAB — GLUCOSE, CAPILLARY: GLUCOSE-CAPILLARY: 135 mg/dL — AB (ref 65–99)

## 2016-11-12 LAB — URINALYSIS, ROUTINE W REFLEX MICROSCOPIC
Bilirubin Urine: NEGATIVE
Glucose, UA: NEGATIVE mg/dL
KETONES UR: 15 mg/dL — AB
LEUKOCYTES UA: NEGATIVE
NITRITE: NEGATIVE
PROTEIN: NEGATIVE mg/dL
Specific Gravity, Urine: 1.015 (ref 1.005–1.030)
pH: 6 (ref 5.0–8.0)

## 2016-11-12 LAB — IRON AND TIBC
Iron: 145 ug/dL (ref 45–182)
Saturation Ratios: 86 % — ABNORMAL HIGH (ref 17.9–39.5)
TIBC: 168 ug/dL — ABNORMAL LOW (ref 250–450)
UIBC: 23 ug/dL

## 2016-11-12 LAB — URINALYSIS, MICROSCOPIC (REFLEX)

## 2016-11-12 LAB — CBC WITH DIFFERENTIAL/PLATELET
BASOS PCT: 1 %
Basophils Absolute: 0.1 10*3/uL (ref 0.0–0.1)
EOS ABS: 0.1 10*3/uL (ref 0.0–0.7)
Eosinophils Relative: 1 %
HCT: 20.2 % — ABNORMAL LOW (ref 39.0–52.0)
Hemoglobin: 7 g/dL — ABNORMAL LOW (ref 13.0–17.0)
Lymphocytes Relative: 7 %
Lymphs Abs: 0.5 10*3/uL — ABNORMAL LOW (ref 0.7–4.0)
MCH: 31.4 pg (ref 26.0–34.0)
MCHC: 34.7 g/dL (ref 30.0–36.0)
MCV: 90.6 fL (ref 78.0–100.0)
MONO ABS: 0.6 10*3/uL (ref 0.1–1.0)
Monocytes Relative: 8 %
NEUTROS PCT: 83 %
Neutro Abs: 6.1 10*3/uL (ref 1.7–7.7)
Platelets: 73 10*3/uL — ABNORMAL LOW (ref 150–400)
RBC: 2.23 MIL/uL — ABNORMAL LOW (ref 4.22–5.81)
RDW: 16.7 % — AB (ref 11.5–15.5)
WBC: 7.4 10*3/uL (ref 4.0–10.5)

## 2016-11-12 LAB — LIPASE, BLOOD: LIPASE: 26 U/L (ref 11–51)

## 2016-11-12 LAB — OCCULT BLOOD X 1 CARD TO LAB, STOOL: FECAL OCCULT BLD: POSITIVE — AB

## 2016-11-12 LAB — VITAMIN B12: VITAMIN B 12: 696 pg/mL (ref 180–914)

## 2016-11-12 LAB — CBG MONITORING, ED
GLUCOSE-CAPILLARY: 83 mg/dL (ref 65–99)
GLUCOSE-CAPILLARY: 204 mg/dL — AB (ref 65–99)
Glucose-Capillary: 49 mg/dL — ABNORMAL LOW (ref 65–99)

## 2016-11-12 LAB — PREPARE RBC (CROSSMATCH)

## 2016-11-12 LAB — FOLATE: FOLATE: 1.7 ng/mL — AB (ref >5.9)

## 2016-11-12 LAB — PROTIME-INR
INR: 1.12
PROTHROMBIN TIME: 14.5 s (ref 11.4–15.2)

## 2016-11-12 LAB — FERRITIN: Ferritin: 1611 ng/mL — ABNORMAL HIGH (ref 24–336)

## 2016-11-12 MED ORDER — DEXTROSE 50 % IV SOLN
INTRAVENOUS | Status: AC
Start: 2016-11-12 — End: 2016-11-12
  Administered 2016-11-12: 50 mL via INTRAVENOUS
  Filled 2016-11-12: qty 50

## 2016-11-12 MED ORDER — ONDANSETRON HCL 4 MG/2ML IJ SOLN
4.0000 mg | Freq: Once | INTRAMUSCULAR | Status: AC
  Administered 2016-11-12: 4 mg via INTRAVENOUS
  Filled 2016-11-12: qty 2

## 2016-11-12 MED ORDER — ACETAMINOPHEN 325 MG PO TABS
650.0000 mg | ORAL_TABLET | Freq: Four times a day (QID) | ORAL | Status: DC | PRN
  Administered 2016-11-12: 650 mg via ORAL
  Filled 2016-11-12 (×2): qty 2

## 2016-11-12 MED ORDER — OCTREOTIDE ACETATE 500 MCG/ML IJ SOLN
50.0000 ug/h | INTRAMUSCULAR | Status: DC
  Administered 2016-11-13 – 2016-11-14 (×3): 50 ug/h via INTRAVENOUS
  Filled 2016-11-12 (×6): qty 1

## 2016-11-12 MED ORDER — FOLIC ACID 1 MG PO TABS
1.0000 mg | ORAL_TABLET | Freq: Every day | ORAL | Status: DC
  Administered 2016-11-13 – 2016-11-20 (×6): 1 mg via ORAL
  Filled 2016-11-12 (×9): qty 1

## 2016-11-12 MED ORDER — THIAMINE HCL 100 MG/ML IJ SOLN
100.0000 mg | Freq: Every day | INTRAMUSCULAR | Status: DC
  Administered 2016-11-14: 100 mg via INTRAVENOUS
  Filled 2016-11-12 (×2): qty 2

## 2016-11-12 MED ORDER — INSULIN GLARGINE 100 UNIT/ML ~~LOC~~ SOLN
12.0000 [IU] | Freq: Every day | SUBCUTANEOUS | Status: DC
  Administered 2016-11-12: 12 [IU] via SUBCUTANEOUS
  Filled 2016-11-12: qty 0.12

## 2016-11-12 MED ORDER — SODIUM CHLORIDE 0.9% FLUSH
3.0000 mL | Freq: Two times a day (BID) | INTRAVENOUS | Status: DC
  Administered 2016-11-12 – 2016-11-22 (×16): 3 mL via INTRAVENOUS

## 2016-11-12 MED ORDER — VITAMIN B-1 100 MG PO TABS
100.0000 mg | ORAL_TABLET | Freq: Every day | ORAL | Status: DC
  Administered 2016-11-13 – 2016-11-20 (×5): 100 mg via ORAL
  Filled 2016-11-12 (×7): qty 1

## 2016-11-12 MED ORDER — PROPRANOLOL HCL 10 MG PO TABS
10.0000 mg | ORAL_TABLET | Freq: Three times a day (TID) | ORAL | Status: DC
  Administered 2016-11-13 (×4): 10 mg via ORAL
  Filled 2016-11-12 (×5): qty 1

## 2016-11-12 MED ORDER — ONDANSETRON HCL 4 MG/2ML IJ SOLN
INTRAMUSCULAR | Status: AC
  Filled 2016-11-12: qty 2

## 2016-11-12 MED ORDER — ONDANSETRON HCL 4 MG/2ML IJ SOLN
4.0000 mg | Freq: Once | INTRAMUSCULAR | Status: AC
  Administered 2016-11-12: 4 mg via INTRAVENOUS

## 2016-11-12 MED ORDER — SODIUM CHLORIDE 0.9 % IV SOLN
Freq: Once | INTRAVENOUS | Status: AC
  Administered 2016-11-13: 250 mL via INTRAVENOUS

## 2016-11-12 MED ORDER — PHYTONADIONE 5 MG PO TABS
10.0000 mg | ORAL_TABLET | Freq: Once | ORAL | Status: AC
  Administered 2016-11-12: 10 mg via ORAL
  Filled 2016-11-12: qty 2

## 2016-11-12 MED ORDER — ADULT MULTIVITAMIN W/MINERALS CH
1.0000 | ORAL_TABLET | Freq: Every day | ORAL | Status: DC
  Administered 2016-11-13 – 2016-11-14 (×2): 1 via ORAL
  Filled 2016-11-12 (×2): qty 1

## 2016-11-12 MED ORDER — PANTOPRAZOLE SODIUM 40 MG IV SOLR: 40.0000 mg | Freq: Two times a day (BID) | INTRAVENOUS | Status: DC

## 2016-11-12 MED ORDER — SODIUM CHLORIDE 0.9 % IV SOLN
INTRAVENOUS | Status: DC
  Administered 2016-11-12: 23:00:00 via INTRAVENOUS

## 2016-11-12 MED ORDER — DEXTROSE 5 % IV SOLN
2.0000 g | INTRAVENOUS | Status: DC
  Administered 2016-11-13 (×2): 2 g via INTRAVENOUS
  Filled 2016-11-12 (×2): qty 2

## 2016-11-12 MED ORDER — LORAZEPAM 2 MG/ML IJ SOLN
1.0000 mg | Freq: Four times a day (QID) | INTRAMUSCULAR | Status: DC | PRN
  Administered 2016-11-14 – 2016-11-15 (×3): 1 mg via INTRAVENOUS
  Filled 2016-11-12 (×2): qty 1

## 2016-11-12 MED ORDER — SODIUM CHLORIDE 0.9 % IV SOLN
80.0000 mg | Freq: Once | INTRAVENOUS | Status: AC
  Administered 2016-11-12: 80 mg via INTRAVENOUS
  Filled 2016-11-12: qty 80

## 2016-11-12 MED ORDER — LORAZEPAM 1 MG PO TABS
1.0000 mg | ORAL_TABLET | Freq: Four times a day (QID) | ORAL | Status: DC | PRN
  Administered 2016-11-12 – 2016-11-13 (×2): 1 mg via ORAL
  Filled 2016-11-12 (×2): qty 1

## 2016-11-12 MED ORDER — ONDANSETRON HCL 4 MG PO TABS: 4.0000 mg | ORAL_TABLET | Freq: Four times a day (QID) | ORAL | Status: DC | PRN

## 2016-11-12 MED ORDER — MORPHINE SULFATE (PF) 2 MG/ML IV SOLN
2.0000 mg | INTRAVENOUS | Status: DC | PRN
  Administered 2016-11-12 – 2016-11-13 (×2): 2 mg via INTRAVENOUS
  Filled 2016-11-12 (×2): qty 1

## 2016-11-12 MED ORDER — INSULIN ASPART 100 UNIT/ML ~~LOC~~ SOLN: 0.0000 [IU] | Freq: Every day | SUBCUTANEOUS | Status: DC

## 2016-11-12 MED ORDER — SODIUM CHLORIDE 0.9 % IV BOLUS (SEPSIS)
1000.0000 mL | Freq: Once | INTRAVENOUS | Status: AC
  Administered 2016-11-12: 1000 mL via INTRAVENOUS

## 2016-11-12 MED ORDER — ONDANSETRON HCL 4 MG/2ML IJ SOLN: 4.0000 mg | Freq: Four times a day (QID) | INTRAMUSCULAR | Status: DC | PRN

## 2016-11-12 MED ORDER — ACETAMINOPHEN 650 MG RE SUPP: 650.0000 mg | Freq: Four times a day (QID) | RECTAL | Status: DC | PRN

## 2016-11-12 MED ORDER — SODIUM CHLORIDE 0.9 % IV SOLN
8.0000 mg/h | INTRAVENOUS | Status: DC
  Administered 2016-11-12 – 2016-11-14 (×4): 8 mg/h via INTRAVENOUS
  Filled 2016-11-12 (×8): qty 80

## 2016-11-12 MED ORDER — INSULIN ASPART 100 UNIT/ML ~~LOC~~ SOLN: 0.0000 [IU] | Freq: Three times a day (TID) | SUBCUTANEOUS | Status: DC

## 2016-11-12 MED ORDER — DEXTROSE 50 % IV SOLN
50.0000 mL | Freq: Once | INTRAVENOUS | Status: AC
  Administered 2016-11-12: 50 mL via INTRAVENOUS

## 2016-11-12 NOTE — ED Notes (Signed)
Pt mother Bettye BoeckCandace Hitson would like to be updated on where pt is being transferred [phone].

## 2016-11-12 NOTE — ED Provider Notes (Signed)
[facility] Provider Note   CSN: [id number] Arrival date & time: 11/12/16  1201     History   Chief Complaint Chief Complaint  Patient presents with  . Generalized pain    HPI Jorge Baker is a 51 y.o. male.  HPI   Jorge Baker is a 51 y.o. male, with a history of DM and alcohol abuse, presenting to the ED with Generalized weakness worsening over the last few months accompanied by nausea and vomiting also for the last few months, worsening over the last week. Patient states he drinks alcohol daily and will finish a half gallon of whiskey during the week. EMS reports the patient's residence was filled with empty whiskey bottles and beer cans. Patient states he has been eating and drinking, but states that he drinks alcohol over making sure that he eats or drinks. When asked about his management of his diabetes, patient states "I take my insulin most days. I take whatever bottle is cold in the refrigerator and I take the number of units that I think is right."  Denies shortness of breath, chest pain, abdominal pain, numbness, changes in vision, fever/chills, diarrhea, or any other complaints.   Past Medical History:  Diagnosis Date  . Diabetes mellitus without complication (HCC)   . ETOH abuse   . Osteoporosis     Patient Active Problem List   Diagnosis Date Noted  . Symptomatic anemia 11/12/2016    Past Surgical History:  Procedure Laterality Date  . EYE SURGERY         Home Medications    Prior to Admission medications   Medication Sig Start Date End Date Taking? Authorizing Provider  insulin lispro (HUMALOG) 100 UNIT/ML injection Inject into the skin.   Yes Historical Provider, MD  amoxicillin-clavulanate (AUGMENTIN XR) 1000-62.5 MG 12 hr tablet Take 2 tablets by mouth 2 (two) times daily. 06/29/16   Tilden Fossa, MD  doxycycline (VIBRAMYCIN) 100 MG capsule Take 1 capsule (100 mg total) by mouth 2 (two) times daily. 06/29/16   Tilden Fossa, MD    oxyCODONE-acetaminophen (PERCOCET/ROXICET) 5-325 MG tablet Take 2 tablets by mouth every 4 (four) hours as needed. 09/02/15   Rolland Porter, MD    Family History No family history on file.  Social History Social History  Substance Use Topics  . Smoking status: Current Every Day Smoker    Packs/day: 1.00    Types: Cigarettes  . Smokeless tobacco: Never Used  . Alcohol use Yes     Allergies   Patient has no known allergies.   Review of Systems Review of Systems  Constitutional: Negative for chills, diaphoresis and fever.  Respiratory: Negative for cough and shortness of breath.   Cardiovascular: Negative for chest pain.  Neurological: Positive for weakness (weakness). Negative for dizziness, syncope, light-headedness, numbness and headaches.  All other systems reviewed and are negative.    Physical Exam Updated Vital Signs BP 133/80   Pulse (!) 127   Temp 98.4 F (36.9 C) (Oral)   Resp 15   Ht 5\' 4"  (1.626 m)   Wt 53.5 kg   SpO2 100%   BMI 20.25 kg/m   Physical Exam  Constitutional: He is oriented to person, place, and time. He appears well-developed and well-nourished. He appears cachectic. No distress.  Chronically ill-appearing  HENT:  Head: Normocephalic and atraumatic.  Mouth/Throat: Mucous membranes are dry.  Eyes: Conjunctivae and EOM are normal.  Patient's pupils are nonreactive to light, however, patient states that this is normal  for him.  Neck: Normal range of motion. Neck supple.  Cardiovascular: Regular rhythm, normal heart sounds and intact distal pulses.  Tachycardia present.   Pulmonary/Chest: Effort normal and breath sounds normal. No respiratory distress.  No increased work of breathing noted.  Abdominal: Soft. There is no tenderness. There is no guarding.  Genitourinary:  Genitourinary Comments: No gross blood or stool burden. Large external hemorrhoids noted. Med tech served as chaperone during the rectal exam.  Musculoskeletal: He exhibits  no edema.  Lymphadenopathy:    He has no cervical adenopathy.  Neurological: He is alert and oriented to person, place, and time.  No sensory deficits. Strength 4/5 in all extremities. Coordination intact including heel to shin and finger to nose. Cranial nerves III-XII grossly intact. No facial droop.   Skin: Skin is warm and dry. He is not diaphoretic.  Generalized, erythematous, flaky patches across the patient's entire body. Alopecia also present in patches.  Psychiatric: He has a normal mood and affect. His behavior is normal.  Nursing note and vitals reviewed.    ED Treatments / Results  Labs (all labs ordered are listed, but only abnormal results are displayed) Labs Reviewed  CBC WITH DIFFERENTIAL/PLATELET - Abnormal; Notable for the following:       Result Value   RBC 2.23 (*)    Hemoglobin 7.0 (*)    HCT 20.2 (*)    RDW 16.7 (*)    Platelets 73 (*)    Lymphs Abs 0.5 (*)    All other components within normal limits  COMPREHENSIVE METABOLIC PANEL - Abnormal; Notable for the following:    Sodium 133 (*)    Chloride 99 (*)    CO2 19 (*)    Creatinine, Ser 0.53 (*)    Calcium 7.9 (*)    Total Protein 5.6 (*)    Albumin 2.2 (*)    AST 181 (*)    ALT 68 (*)    Alkaline Phosphatase 306 (*)    All other components within normal limits  URINALYSIS, ROUTINE W REFLEX MICROSCOPIC - Abnormal; Notable for the following:    Color, Urine AMBER (*)    Hgb urine dipstick TRACE (*)    Ketones, ur 15 (*)    All other components within normal limits  OCCULT BLOOD X 1 CARD TO LAB, STOOL - Abnormal; Notable for the following:    Fecal Occult Bld POSITIVE (*)    All other components within normal limits  URINALYSIS, MICROSCOPIC (REFLEX) - Abnormal; Notable for the following:    Bacteria, UA MANY (*)    Squamous Epithelial / LPF 0-5 (*)    All other components within normal limits  CBG MONITORING, ED - Abnormal; Notable for the following:    Glucose-Capillary 49 (*)    All other  components within normal limits  CBG MONITORING, ED - Abnormal; Notable for the following:    Glucose-Capillary 204 (*)    All other components within normal limits  URINE CULTURE  LIPASE, BLOOD  VITAMIN B12  FOLATE  IRON AND TIBC  FERRITIN  RETICULOCYTES  CBG MONITORING, ED  POC OCCULT BLOOD, ED    EKG  EKG Interpretation None       Radiology Dg Chest 2 View  Result Date: 11/12/2016 CLINICAL DATA:  Tachycardia, weakness. EXAM: CHEST  2 VIEW COMPARISON:  Radiographs of June 29, 2016. FINDINGS: The heart size and mediastinal contours are within normal limits. Both lungs are clear. No pneumothorax or pleural effusion is noted. The visualized  skeletal structures are unremarkable. IMPRESSION: No active cardiopulmonary disease. Electronically Signed   By: Lupita RaiderJames  Green Jr, M.D.   On: 11/12/2016 13:50    Procedures Procedures (including critical care time)  Medications Ordered in ED Medications  sodium chloride 0.9 % bolus 1,000 mL (0 mLs Intravenous Stopped 11/12/16 1457)    Followed by  sodium chloride 0.9 % bolus 1,000 mL (0 mLs Intravenous Stopped 11/12/16 1457)  ondansetron (ZOFRAN) injection 4 mg (4 mg Intravenous Given 11/12/16 1252)  dextrose 50 % solution 50 mL (50 mLs Intravenous Given 11/12/16 1529)     Initial Impression / Assessment and Plan / ED Course  I have reviewed the triage vital signs and the nursing notes.  Pertinent labs & imaging results that were available during my care of the patient were reviewed by me and considered in my medical decision making (see chart for details).      Patient presents with generalized weakness accompanied by nausea and vomiting. These complaints have been going on for a number of weeks. Patient's hemoglobin noted to be at 7. His positive Hemoccult may be due to his large hemorrhoids noted on exam. Patient to be transferred and treated for symptomatic anemia.    2:24 PM Spoke with Dr. Melynda RippleHobbs, hospitalist, who agreed to admit  the patient to [facility] telemetry observation. Requests that the UA be returned and an anemia panel be drawn prior transfer. Patient had an episode of hypoglycemia of 49. He remained alert and oriented. He was given food and D50.    Findings and plan of care discussed with Geoffery Lyonsouglas Delo, MD.   Vitals:   11/12/16 1400 11/12/16 1430 11/12/16 1500 11/12/16 1622  BP: 149/96 135/91 140/88 132/87  Pulse: 120 (!) 127 (!) 126 (!) 134  Resp: 18 18 25 18   Temp:      TempSrc:      SpO2: 93% 91% 96% 97%  Weight:      Height:           Final Clinical Impressions(s) / ED Diagnoses   Final diagnoses:  Generalized weakness  Symptomatic anemia    New Prescriptions New Prescriptions   No medications on file     Anselm PancoastShawn C Ronica Vivian, PA-C 11/12/16 1644    Kaiesha Tonner Bretta BangC Jovaughn Wojtaszek, PA-C 11/12/16 1708    Geoffery Lyonsouglas Delo, MD 11/16/16 [phone]

## 2016-11-12 NOTE — Progress Notes (Signed)
Refused skin assessment from admitting nurse and oncoming.

## 2016-11-12 NOTE — ED Triage Notes (Addendum)
Presents via EMS. Pt mother called EMS for pt due to declining health over the past few months and inability to walk. Pt advises pain all over. Pt drinks alcohol daily and states he has been drinking today. Hx of diabetes. Pt has not been able to get to his dr appts due to pain and immobility.

## 2016-11-12 NOTE — H&P (Signed)
History and Physical    Jorge Baker RUE:[id number] DOB: 02/03/1966 DOA: 11/12/2016  PCP: Lavell Islam, MD Consultants:  Dermatology; Endocrinology;  Patient coming from: home; lives with mother and father; Jackey Loge: mother, [phone]  Chief Complaint: weakness  HPI: Jorge Baker is a 51 y.o. male with medical history significant of DM with resultant visual impairment, osteoporosis and ETOH abuse presenting because he feels like "something is sucking the life out of me."    Has been feeling bad for the last 4-5 days.  Noticed lack of energy first.  Then total loss of energy.  Unable to stand because he is so weak.  No SOB.  Whole body hurts, mostly the last 3 days.  Chronic cough, no change fgrom baseline.  No fevers.  No abdominal pain.  Doesn't look at stools, has not noticed color change.  Has had loose stools for over a year.  +n/v for a week or more, denies blood in it.  For pain, takes oxycodone if it is really bad; if not too bad he just drinks more alcohol.  Drinks 1/4-1/2 cup of alcohol all day every day.  Prefers bourbon whiskey.  Cannot remember the last time he did not drink.  Denies h/o withdrawals/seizures.  Denies bleeding.  Since he was at [facility], his right knee is killing him; this was acute in onset and is ongoing.   ED Course: Per PA-C Joy: Patient presents with generalized weakness accompanied by nausea and vomiting. These complaints have been going on for a number of weeks. Patient's hemoglobin noted to be at 7. His positive Hemoccult may be due to his large hemorrhoids noted on exam. Patient to be transferred and treated for symptomatic anemia.  2:24 PM Spoke with Dr. Melynda Ripple, hospitalist, who agreed to admit the patient to [facility] telemetry observation. Requests that the UA be returned and an anemia panel be drawn prior transfer.  Patient had an episode of hypoglycemia of 49. He remained alert and oriented. He was given food and D50.   Review of Systems: As per HPI;  otherwise 10 point review of systems reviewed and negative.   Ambulatory Status:  Walked independently prior to this episode  Past Medical History:  Diagnosis Date  . Diabetes mellitus without complication (HCC)   . ETOH abuse   . Osteoporosis   . 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 EYE SURGERY      Social History   Social History  . Marital status: Divorced    Spouse name: N/A  . Number of children: N/A  . Years of education: N/A   Occupational History  . disabled    Social History Main Topics  . Smoking status: Current Every Day Smoker    Packs/day: 1.25    Years: 36.00    Types: Cigarettes  . Smokeless tobacco: Never Used  . Alcohol use Yes     Comment: see HPI  . Drug use: No  . Sexual activity: No   Other Topics Concern  . Not on file   Social History Narrative  . No narrative on file    No Known Allergies  Family History  Problem Relation Age of Onset  . CVA Father     Prior to Admission medications   Medication Sig Start Date End Date Taking? Authorizing Provider  insulin glargine (LANTUS) 100 UNIT/ML injection Inject 12 Units into the skin at bedtime.   Yes Historical Provider, MD  insulin lispro (HUMALOG) 100 UNIT/ML injection Inject 10 Units into the skin daily. Per sliding scale   Yes Historical Provider, MD  amoxicillin-clavulanate (AUGMENTIN XR) 1000-62.5 MG 12 hr tablet Take 2 tablets by mouth 2 (two) times daily. 06/29/16   Tilden Fossa, MD  doxycycline (VIBRAMYCIN) 100 MG capsule Take 1 capsule (100 mg total) by mouth 2 (two) times daily. 06/29/16   Tilden Fossa, MD  oxyCODONE-acetaminophen (PERCOCET/ROXICET) 5-325 MG tablet Take 2 tablets by mouth every 4 (four) hours as needed. 09/02/15   Rolland Porter, MD    Physical Exam: Vitals:   11/12/16 1430 11/12/16 1500 11/12/16 1622 11/12/16 1808  BP: 135/91 140/88 132/87 133/77  Pulse: (!) 127 (!) 126 (!) 134 (!) 136    Resp: 18 25 18 20   Temp:    98.2 F (36.8 C)  TempSrc:    Oral  SpO2: 91% 96% 97% 93%  Weight:    54.6 kg (120 lb 5.9 oz)  Height:    5\' 4"  (1.626 m)     General: Appears calm and comfortable and is NAD but is somewhat anxious and contakerous Eyes:  EOMI, normal lids, iris ENT:  grossly normal hearing, lips & tongue, mmm Neck:  no LAD, masses or thyromegaly Cardiovascular:  Tachycardia, no m/r/g. No LE edema.  Respiratory: CTA bilaterally, no w/r/r. Normal respiratory effort. Abdomen:  soft, ntnd, NABS; he does have a protuberant abdomen that may be c/w ascites, but the patient reports that this is his usual abdominal appearance Musculoskeletal:  grossly normal tone BUE/BLE, good ROM, no bony abnormality; right knee with tenderness but no effusion Psychiatric:somewhat anxious, speech fluent and appropriate, AOx3 Neurologic:  CN 2-12 grossly intact, moves all extremities in coordinated fashion, sensation intact  Labs on Admission: I have personally reviewed following labs and imaging studies  CBC:  Recent Labs Lab 11/12/16 1235  WBC 7.4  NEUTROABS 6.1  HGB 7.0*  HCT 20.2*  MCV 90.6  PLT 73*   Basic Metabolic Panel:  Recent Labs Lab 11/12/16 1235  NA 133*  K 4.3  CL 99*  CO2 19*  GLUCOSE 91  BUN 20  CREATININE 0.53*  CALCIUM 7.9*   GFR: Estimated Creatinine Clearance: 86.3 mL/min (by C-G formula based on SCr of 0.53 mg/dL (L)). Liver Function Tests:  Recent Labs Lab 11/12/16 1235  AST 181*  ALT 68*  ALKPHOS 306*  BILITOT 1.0  PROT 5.6*  ALBUMIN 2.2*    Recent Labs Lab 11/12/16 1235  LIPASE 26   No results for input(s): AMMONIA in the last 168 hours. Coagulation Profile:  Recent Labs Lab 11/12/16 2007  INR 1.12   Cardiac Enzymes: No results for input(s): CKTOTAL, CKMB, CKMBINDEX, TROPONINI in the last 168 hours. BNP (last 3 results) No results for input(s): PROBNP in the last 8760 hours. HbA1C: No results for input(s): HGBA1C in the  last 72 hours. CBG:  Recent Labs Lab 11/12/16 1255 11/12/16 1518 11/12/16 1619 11/12/16 2113  GLUCAP 83 49* 204* 135*   Lipid Profile: No results for input(s): CHOL, HDL, LDLCALC, TRIG, CHOLHDL, LDLDIRECT in the last 72 hours. Thyroid Function Tests: No results for input(s): TSH, T4TOTAL, FREET4, T3FREE, THYROIDAB in the last 72 hours. Anemia Panel:  Recent Labs  11/12/16 1235 11/12/16 1455  VITAMINB12  --  696  FOLATE  --  1.7*  FERRITIN  --  1,611*  TIBC  --  168*  IRON  --  145  RETICCTPCT 1.9  --    Urine analysis:  Component Value Date/Time   COLORURINE AMBER (A) 11/12/2016 1455   APPEARANCEUR CLEAR 11/12/2016 1455   LABSPEC 1.015 11/12/2016 1455   PHURINE 6.0 11/12/2016 1455   GLUCOSEU NEGATIVE 11/12/2016 1455   HGBUR TRACE (A) 11/12/2016 1455   BILIRUBINUR NEGATIVE 11/12/2016 1455   KETONESUR 15 (A) 11/12/2016 1455   PROTEINUR NEGATIVE 11/12/2016 1455   NITRITE NEGATIVE 11/12/2016 1455   LEUKOCYTESUR NEGATIVE 11/12/2016 1455    Creatinine Clearance: Estimated Creatinine Clearance: 86.3 mL/min (by C-G formula based on SCr of 0.53 mg/dL (L)).  Sepsis Labs: @LABRCNTIP (procalcitonin:4,lacticidven:4) )No results found for this or any previous visit (from the past 240 hour(s)).   Radiological Exams on Admission: Dg Chest 2 View  Result Date: 11/12/2016 CLINICAL DATA:  Tachycardia, weakness. EXAM: CHEST  2 VIEW COMPARISON:  Radiographs of June 29, 2016. FINDINGS: The heart size and mediastinal contours are within normal limits. Both lungs are clear. No pneumothorax or pleural effusion is noted. The visualized skeletal structures are unremarkable. IMPRESSION: No active cardiopulmonary disease. Electronically Signed   By: Lupita Raider, M.D.   On: 11/12/2016 13:50    EKG: Independently reviewed.  Sinus tachycardia with rate 129; nonspecific ST changes with no evidence of acute ischemia  Assessment/Plan Principal Problem:   Symptomatic anemia Active  Problems:   Upper GI bleeding   Diabetes mellitus, type 2 (HCC)   Alcohol abuse   Right knee pain   Symptomatic anemia, likely related to upper GI bleeding -Hgb 7.0, normocytic -Folate deficiency -Heme positive stools -While I was in the room, the patient retched a bit and then had what appeared to be clots of bright red blood in the bottom of the emesis bag -Patient's weakness and fatigue are most likely caused by anemia secondary to upper GI bleeding.  -Patient has history of alcoholism,concern would be for bleeding from  esophageal varices.  -Another potential differential diagnoses is gastric or duodenal ulcer, which is less likely given no abdominal pain.  -The patient is currently tachycardic, suggesting acute volume loss.  -Patient will be transfused with 2 units PRBC. -I called and discussed the patient with Dr. Myrtie Neither -Based on our discussion, will not place an NGT tube at this time. -Will start on both Octreotide and Protonix drips -Will give 10 mg PO Vitamin K x 1 -Will start Rocephin empirically for prophylaxis -Will admit to telemetry bed - GI to see in the AM - NPO for possible EGD - NS at 75 mL/hr - Zofran IV for nausea - Avoid NSAIDs and SQ heparin - Maintain IV access (2 large bore IVs if possible). - Monitor closely and follow cbc, transfuse as necessary. - IV Rocephin 1 g X 7 days for PPx  -Will add propranolol, which may also help tachycardia; if no varices seen on EGD, can discontinue -Albumin 2.2  ETOH dependence  -Patient with chronic ETOH dependence -Has failed rehab in the past -Currently no idea when his last day of non-drinking was -ETOH level pending -He is at high risk for complications of withdrawal including seizures, DTs -Will observe -CIWA protocol -AST 181, ALT 68 - Elevated LFTs are likely related to alcoholism -Thiamine and folate ordered -Folate deficient, will need on an ongoing basis  DM -Continue Lantus -Last A1c was 5.1 in  2/17 -By report to ER PA, the patient is fairly haphazard and inconsistent with his insulin dosing -Will cover with SSI -May benefit from diabetes education prior to discharge  Knee pain -He reports [facility] onset of [facility] -Denies trauma -Knee does not appear warm, red, or have other obvious abnormality -Will obtain knee xray    DVT prophylaxis:  SCDs Code Status: Full - confirmed with patient Family Communication: None present Disposition Plan:  Home once clinically improved Consults called: GI  Admission status: It is my clinical opinion that referral for OBSERVATION is reasonable and necessary in this patient based on the above information provided. The aforementioned taken together are felt to place the patient at high risk for further clinical deterioration. However it is anticipated that the patient may be medically stable for discharge from the hospital within 24 to 48 hours.     Jorge BlueJennifer Jaycie Kregel MD [facility]  If 7PM-7AM, please contact night-coverage [email or web address] Password [email or web address]  11/12/2016, 10:34 PM

## 2016-11-12 NOTE — ED Notes (Signed)
Pt made aware of the need for a urine sample. °

## 2016-11-12 NOTE — ED Notes (Signed)
PA and RN notified of CBG. Pt given PB cracker and grape juice.

## 2016-11-12 NOTE — Progress Notes (Signed)
Patient arrived via EMS. Attempted to assess, patient stated "no" and stated that he wanted something for his knee pain. Spoke with Dr. Melynda RippleHobbs over the phone to notify him of patients arrival. Placed on tele monitoring.

## 2016-11-12 NOTE — ED Notes (Signed)
Pt on auto VS [id number]

## 2016-11-12 NOTE — ED Notes (Signed)
Pt reports N/V daily, states he drinks more alcohol than he eats. Pt denies chest pain,. SOB

## 2016-11-12 NOTE — ED Notes (Signed)
Pt on automatic VS and cardiac monitor 

## 2016-11-12 NOTE — ED Notes (Signed)
ED Provider at bedside. 

## 2016-11-12 NOTE — ED Notes (Signed)
Candace (mother) given information about pt's admission.

## 2016-11-12 NOTE — Plan of Care (Signed)
[facility] Summary Note  51 year old male with past oral history significant for diabetes, osteoporosis and alcohol abuse [facility] inability to [facility]. Patient drinks daily and last drink was today. States he has not been getting to the doctor due to his immobility. Patient was found to have a hemoglobin is 7. Diagnosed with symptomatic anemia. Patient to get transfused once he arrives to [facility]. EDP to obtain UA and anemia panel. Patient will go to a telemetry bed on arrival. Observation status. Guaiac pos.  Haydee SalterPhillip M Hanaan Gancarz MD

## 2016-11-13 ENCOUNTER — Encounter (HOSPITAL_COMMUNITY): Payer: Self-pay | Admitting: Physician Assistant

## 2016-11-13 ENCOUNTER — Observation Stay (HOSPITAL_COMMUNITY): Payer: Medicare HMO

## 2016-11-13 DIAGNOSIS — F1010 Alcohol abuse, uncomplicated: Secondary | ICD-10-CM | POA: Diagnosis not present

## 2016-11-13 DIAGNOSIS — R140 Abdominal distension (gaseous): Secondary | ICD-10-CM | POA: Diagnosis not present

## 2016-11-13 DIAGNOSIS — Z794 Long term (current) use of insulin: Secondary | ICD-10-CM | POA: Diagnosis not present

## 2016-11-13 DIAGNOSIS — D6959 Other secondary thrombocytopenia: Secondary | ICD-10-CM | POA: Diagnosis present

## 2016-11-13 DIAGNOSIS — H539 Unspecified visual disturbance: Secondary | ICD-10-CM | POA: Diagnosis present

## 2016-11-13 DIAGNOSIS — Z79899 Other long term (current) drug therapy: Secondary | ICD-10-CM | POA: Diagnosis not present

## 2016-11-13 DIAGNOSIS — R531 Weakness: Secondary | ICD-10-CM | POA: Diagnosis present

## 2016-11-13 DIAGNOSIS — E10649 Type 1 diabetes mellitus with hypoglycemia without coma: Secondary | ICD-10-CM | POA: Diagnosis present

## 2016-11-13 DIAGNOSIS — K648 Other hemorrhoids: Secondary | ICD-10-CM | POA: Diagnosis present

## 2016-11-13 DIAGNOSIS — K922 Gastrointestinal hemorrhage, unspecified: Secondary | ICD-10-CM | POA: Diagnosis not present

## 2016-11-13 DIAGNOSIS — F17210 Nicotine dependence, cigarettes, uncomplicated: Secondary | ICD-10-CM | POA: Diagnosis present

## 2016-11-13 DIAGNOSIS — F10239 Alcohol dependence with withdrawal, unspecified: Secondary | ICD-10-CM | POA: Diagnosis not present

## 2016-11-13 DIAGNOSIS — E876 Hypokalemia: Secondary | ICD-10-CM | POA: Diagnosis not present

## 2016-11-13 DIAGNOSIS — N5089 Other specified disorders of the male genital organs: Secondary | ICD-10-CM | POA: Diagnosis not present

## 2016-11-13 DIAGNOSIS — K7011 Alcoholic hepatitis with ascites: Secondary | ICD-10-CM | POA: Diagnosis present

## 2016-11-13 DIAGNOSIS — E1065 Type 1 diabetes mellitus with hyperglycemia: Secondary | ICD-10-CM | POA: Diagnosis not present

## 2016-11-13 DIAGNOSIS — K219 Gastro-esophageal reflux disease without esophagitis: Secondary | ICD-10-CM | POA: Diagnosis present

## 2016-11-13 DIAGNOSIS — D649 Anemia, unspecified: Secondary | ICD-10-CM | POA: Diagnosis not present

## 2016-11-13 DIAGNOSIS — M810 Age-related osteoporosis without current pathological fracture: Secondary | ICD-10-CM | POA: Diagnosis present

## 2016-11-13 DIAGNOSIS — E1069 Type 1 diabetes mellitus with other specified complication: Secondary | ICD-10-CM | POA: Diagnosis present

## 2016-11-13 DIAGNOSIS — F329 Major depressive disorder, single episode, unspecified: Secondary | ICD-10-CM | POA: Diagnosis present

## 2016-11-13 DIAGNOSIS — R000 Tachycardia, unspecified: Secondary | ICD-10-CM | POA: Diagnosis not present

## 2016-11-13 DIAGNOSIS — K7010 Alcoholic hepatitis without ascites: Secondary | ICD-10-CM | POA: Diagnosis not present

## 2016-11-13 DIAGNOSIS — K921 Melena: Secondary | ICD-10-CM | POA: Diagnosis present

## 2016-11-13 DIAGNOSIS — F1099 Alcohol use, unspecified with unspecified alcohol-induced disorder: Secondary | ICD-10-CM | POA: Diagnosis not present

## 2016-11-13 DIAGNOSIS — K649 Unspecified hemorrhoids: Secondary | ICD-10-CM | POA: Diagnosis not present

## 2016-11-13 DIAGNOSIS — K644 Residual hemorrhoidal skin tags: Secondary | ICD-10-CM | POA: Diagnosis present

## 2016-11-13 DIAGNOSIS — E7220 Disorder of urea cycle metabolism, unspecified: Secondary | ICD-10-CM | POA: Diagnosis present

## 2016-11-13 DIAGNOSIS — D62 Acute posthemorrhagic anemia: Secondary | ICD-10-CM | POA: Diagnosis present

## 2016-11-13 DIAGNOSIS — M25561 Pain in right knee: Secondary | ICD-10-CM | POA: Diagnosis present

## 2016-11-13 DIAGNOSIS — K2990 Gastroduodenitis, unspecified, without bleeding: Secondary | ICD-10-CM | POA: Diagnosis not present

## 2016-11-13 DIAGNOSIS — K2970 Gastritis, unspecified, without bleeding: Secondary | ICD-10-CM | POA: Diagnosis present

## 2016-11-13 LAB — HEMOGLOBIN AND HEMATOCRIT, BLOOD
HCT: 21.4 % — ABNORMAL LOW (ref 39.0–52.0)
HEMOGLOBIN: 7.4 g/dL — AB (ref 13.0–17.0)

## 2016-11-13 LAB — CBC
HCT: 21.5 % — ABNORMAL LOW (ref 39.0–52.0)
Hemoglobin: 7.3 g/dL — ABNORMAL LOW (ref 13.0–17.0)
MCH: 30.5 pg (ref 26.0–34.0)
MCHC: 34 g/dL (ref 30.0–36.0)
MCV: 90 fL (ref 78.0–100.0)
Platelets: 60 10*3/uL — ABNORMAL LOW (ref 150–400)
RBC: 2.39 MIL/uL — ABNORMAL LOW (ref 4.22–5.81)
RDW: 16.5 % — AB (ref 11.5–15.5)
WBC: 6.8 10*3/uL (ref 4.0–10.5)

## 2016-11-13 LAB — GLUCOSE, CAPILLARY
GLUCOSE-CAPILLARY: 105 mg/dL — AB (ref 65–99)
GLUCOSE-CAPILLARY: 107 mg/dL — AB (ref 65–99)
GLUCOSE-CAPILLARY: 108 mg/dL — AB (ref 65–99)
GLUCOSE-CAPILLARY: 118 mg/dL — AB (ref 65–99)
GLUCOSE-CAPILLARY: 126 mg/dL — AB (ref 65–99)
GLUCOSE-CAPILLARY: 150 mg/dL — AB (ref 65–99)
GLUCOSE-CAPILLARY: 38 mg/dL — AB (ref 65–99)
GLUCOSE-CAPILLARY: 45 mg/dL — AB (ref 65–99)
GLUCOSE-CAPILLARY: 81 mg/dL (ref 65–99)
Glucose-Capillary: 61 mg/dL — ABNORMAL LOW (ref 65–99)
Glucose-Capillary: 64 mg/dL — ABNORMAL LOW (ref 65–99)
Glucose-Capillary: 39 mg/dL — CL (ref 65–99)
Glucose-Capillary: 55 mg/dL — ABNORMAL LOW (ref 65–99)
Glucose-Capillary: 134 mg/dL — ABNORMAL HIGH (ref 65–99)
Glucose-Capillary: 64 mg/dL — ABNORMAL LOW (ref 65–99)
Glucose-Capillary: 59 mg/dL — ABNORMAL LOW (ref 65–99)

## 2016-11-13 LAB — ETHANOL: Alcohol, Ethyl (B): <5 mg/dL (ref <5)

## 2016-11-13 LAB — AMMONIA: AMMONIA: 60 umol/L — AB (ref 9–35)

## 2016-11-13 LAB — PROTIME-INR
INR: 1.14
PROTHROMBIN TIME: 14.7 s (ref 11.4–15.2)

## 2016-11-13 LAB — ABO/RH: ABO/RH(D): AB POS

## 2016-11-13 MED ORDER — DEXTROSE 50 % IV SOLN
50.0000 mL | Freq: Once | INTRAVENOUS | Status: AC
  Administered 2016-11-13: 50 mL via INTRAVENOUS

## 2016-11-13 MED ORDER — DEXTROSE 50 % IV SOLN
INTRAVENOUS | Status: AC
  Administered 2016-11-13: 50 mL
  Filled 2016-11-13: qty 50

## 2016-11-13 MED ORDER — DEXTROSE-NACL 5-0.45 % IV SOLN
INTRAVENOUS | Status: DC
  Administered 2016-11-13: 1000 mL via INTRAVENOUS

## 2016-11-13 MED ORDER — DEXTROSE 50 % IV SOLN
INTRAVENOUS | Status: AC
  Filled 2016-11-13: qty 50

## 2016-11-13 MED ORDER — INSULIN ASPART 100 UNIT/ML ~~LOC~~ SOLN: 0.0000 [IU] | Freq: Three times a day (TID) | SUBCUTANEOUS | Status: DC

## 2016-11-13 MED ORDER — DEXTROSE 50 % IV SOLN
INTRAVENOUS | Status: AC
  Administered 2016-11-13: 25 mL
  Filled 2016-11-13: qty 50

## 2016-11-13 MED ORDER — DEXTROSE 10 % IV SOLN
INTRAVENOUS | Status: DC
  Administered 2016-11-13: 50 mL/h via INTRAVENOUS
  Administered 2016-11-14 – 2016-11-15 (×4): via INTRAVENOUS

## 2016-11-13 MED ORDER — DEXTROSE 50 % IV SOLN
25.0000 mL | Freq: Once | INTRAVENOUS | Status: AC
  Administered 2016-11-13: 25 mL via INTRAVENOUS

## 2016-11-13 MED ORDER — INSULIN ASPART 100 UNIT/ML ~~LOC~~ SOLN: 0.0000 [IU] | Freq: Every day | SUBCUTANEOUS | Status: DC

## 2016-11-13 MED ORDER — DEXTROSE 50 % IV SOLN
INTRAVENOUS | Status: AC
Start: 2016-11-13 — End: 2016-11-13
  Administered 2016-11-13: 25 mL
  Filled 2016-11-13: qty 50

## 2016-11-13 MED ORDER — DEXTROSE 50 % IV SOLN
INTRAVENOUS | Status: AC
  Administered 2016-11-13 (×2): 25 mL
  Filled 2016-11-13: qty 50

## 2016-11-13 MED ORDER — INSULIN GLARGINE 100 UNIT/ML ~~LOC~~ SOLN
5.0000 [IU] | Freq: Every day | SUBCUTANEOUS | Status: DC
  Administered 2016-11-13 – 2016-11-14 (×2): 5 [IU] via SUBCUTANEOUS
  Filled 2016-11-13 (×3): qty 0.05

## 2016-11-13 NOTE — Consult Note (Signed)
[facility] Consult: 9:39 AM 11/13/2016  LOS: 0 days    Referring Provider: Dr Benjamine MolaVann  Primary Care Physician:  Lavell IslamLOWARD,DAVIS L, MD Primary Gastroenterologist:  Gentry FitzUnassigned.    Quinn AxeBrian Smith at [facility] [place].  Marland Kitchen.     Reason for Consultation:  Anemia. FOBT +/      HPI: Jorge Baker is a 51 y.o. male.  PMH type 1 DM since age 51.  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  Alcoholism and alcoholic hepatitis in 2015 and 06/2016.  f-actin Ab normal in 02/2014. Fatty liver per ultrasounds in 2015 and 06/2016.  Thrombocytopenia (81K) and hyponatremia (129) per 06/2016 labs.  No diagnosis of liver disease.  No prior history of upper endoscopy or colonoscopy.  No previous history of anemia.   Pt is very lethargic and unable to answer even most simple questions, perhaps due to blood sugars in the 30s and 40s.  So hx obtained by phone conversation with his mother.  Patient re-stuck himself for insulin pump access last Monday, this is done every 3 days. He got so frustrated with the insulin pump that on Tuesday he pulled it out and resorted to administrating insulin via syringe.   He had been having and continued to have difficulty regulating his blood sugars and his mother says that he would see numbers up into the 170s and feel that his sugar was low and readings in the 20's and 30s  would be interpreted by him as being high. On at least one occasion last week his mother had to mix up a sugar solution because of glucose in the 20s.  He has been progressively weaker and more lethargic for at least a week. He is hardly able to stand up. He is living in a out building at his mother's house that has no running water, his wishes.  Mom  emptied black loose stool from bedside commode last week. His mother hasn't emptied the commode since and so black stool may be ongoing. He vomited recently and there was blood in the vomit.  Mom saw some dark emesis a couple of weeks ago.  Mother also tells me that he hardly is eating, in fact last week there were 2 or 3 days where she doesn't think he ate anything.    At arrival to ED he reported total body aches/pains, no change in cough.  Patient unable to tell me if he's been using any NSAIDs or ASA products.  Consumes 1.75 liters of bourbon every 10 to 14 days.   Hgb 7.  Was 12 in 06/2016.  MCV 90.  Platelets 73.  coags normal.  Iron ok, folate low. AST/ALT 181/68. Alkaline phosphatase 306. Total bilirubin normal at 1. Octreotide, Protonix drip started.  RN says that stool this AM was soft, dark but not melenic or grossly bloody.     Past Medical History:  Diagnosis Date  . Diabetes mellitus without complication (HCC)   . ETOH abuse   .  Osteoporosis   . 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 EYE SURGERY      Prior to Admission medications   Medication Sig Start Date End Date Taking? Authorizing Provider  insulin glargine (LANTUS) 100 UNIT/ML injection Inject 12 Units into the skin at bedtime.   Yes Historical Provider, MD  insulin lispro (HUMALOG) 100 UNIT/ML injection Inject 10 Units into the skin daily. Per sliding scale   Yes Historical Provider, MD  oxyCODONE-acetaminophen (PERCOCET/ROXICET) 5-325 MG tablet Take 2 tablets by mouth every 4 (four) hours as needed. 09/02/15   Rolland Porter, MD    Scheduled Meds: . cefTRIAXone (ROCEPHIN)  IV  2 g Intravenous Q24H  . folic acid  1 mg Oral Daily  . insulin aspart  0-15 Units Subcutaneous TID WC  . insulin aspart  0-5 Units Subcutaneous QHS  . insulin glargine  12 Units Subcutaneous QHS  . multivitamin with minerals  1 tablet Oral Daily  . [START ON 11/16/2016]  pantoprazole  40 mg Intravenous Q12H  . propranolol  10 mg Oral TID  . sodium chloride flush  3 mL Intravenous Q12H  . thiamine  100 mg Oral Daily   Or  . thiamine  100 mg Intravenous Daily   Infusions: . dextrose 5 % and 0.45% NaCl 1,000 mL (11/13/16 0621)  . octreotide  (SANDOSTATIN)    IV infusion 50 mcg/hr (11/13/16 0003)  . pantoprozole (PROTONIX) infusion 8 mg/hr (11/12/16 2142)   PRN Meds: acetaminophen **OR** acetaminophen, LORazepam **OR** LORazepam, morphine injection, ondansetron **OR** ondansetron (ZOFRAN) IV   Allergies as of 11/12/2016  . (No Known Allergies)    Family History  Problem Relation Age of Onset  . CVA Father     Social History   Social History  . Marital status: Divorced    Spouse name: N/A  . Number of children: N/A  . Years of education: N/A   Occupational History  . disabled    Social History Main Topics  . Smoking status: Current Every Day Smoker    Packs/day: 1.25    Years: 36.00    Types: Cigarettes  . Smokeless tobacco: Never Used  . Alcohol use Yes     Comment: see HPI  . Drug use: No  . Sexual activity: No   Other Topics Concern  . Not on file   Social History Narrative  . No narrative on file    REVIEW OF SYSTEMS: Constitutional:  Per HPI ENT:  No nose bleeds Pulm:  Cough, chronic CV:  No palpitations, no LE edema.  No chest pain GU:  No hematuria, no frequency GI:  Per HPI Heme:  Denies unusual bleeding or bruising.   Transfusions:  none Neuro:  No headaches, no peripheral tingling or numbness Derm:  Seen by a dermatologist in December but missed follow-up appointment in January. This regarding rash on his trunk and face. He was given an oral antibiotic which he discontinued and a topical cream. Endocrine:  No sweats or chills.  No polyuria or dysuria Immunization:  Not clarified Travel:  None beyond local counties in last few months.    PHYSICAL EXAM: Vital signs in last 24 hours: Vitals:   11/13/16 0316  11/13/16 0530  BP: (!) 145/76 112/66  Pulse: 85 81  Resp: 20 20  Temp: 98 F (36.7 C) 97.4 F (36.3 C)   Wt Readings from Last 3 Encounters:  11/12/16 54.6 kg (120 lb  5.9 oz)  06/29/16 56.2 kg (124 lb)  09/02/15 61.7 kg (136 lb)    General: Unwell, somewhat cushingoid appearing.  Somnolent  Head:  No asymmetry. Rash across the face with hyperpigmentation.  Eyes:  No scleral icterus. No conjunctival pallor. Ears:  Unable to truly assess hearing due to altered mental status. He does respond to normal volume voice  Nose:  No congestion or discharge Mouth:  Mucosa pink. Patient wouldn't open his mouth. Neck:  No mass, no JVD, no thyromegaly. Lungs:  Clear bilaterally. No labored breathing. Heart: RRR. No MRG. S1, S2 present Abdomen:  Protuberant, soft. No HSM, masses, bruits. Small umbilical hernia. GU:  No scrotal edema..   Rectal: Deferred   Musc/Skeltl: No gross joint contracture deformities or swelling or erythema.  Muscles in the legs are moderately atrophic Extremities:  No CCE.  Neurologic:  Patient wouldn't answer many questions his mentation is very slow. He is able to tell me that he is incontinent hospital and response to his name but could not give me the year. Skin:  Hyperpigmented splotches and some erythema on the face and trunk. Nodes:  No cervical adenopathy.   Psych:  Very flat affect.  Intake/Output from previous day: 02/04 0701 - 02/05 0700 In: 2345 [I.V.:10; Blood:335; IV Piggyback:2000] Out: -  Intake/Output this shift: No intake/output data recorded.  LAB RESULTS:  Recent Labs  11/12/16 1235  WBC 7.4  HGB 7.0*  HCT 20.2*  PLT 73*   BMET Lab Results  Component Value Date   NA 133 (L) 11/12/2016   NA 129 (L) 06/29/2016   K 4.3 11/12/2016   K 4.3 06/29/2016   CL 99 (L) 11/12/2016   CL 96 (L) 06/29/2016   CO2 19 (L) 11/12/2016   CO2 23 06/29/2016   GLUCOSE 91 11/12/2016   GLUCOSE 92 06/29/2016   BUN 20 11/12/2016   BUN 5 (L) 06/29/2016    CREATININE 0.53 (L) 11/12/2016   CREATININE 0.60 (L) 06/29/2016   CALCIUM 7.9 (L) 11/12/2016   CALCIUM 8.1 (L) 06/29/2016   LFT  Recent Labs  11/12/16 1235  PROT 5.6*  ALBUMIN 2.2*  AST 181*  ALT 68*  ALKPHOS 306*  BILITOT 1.0   PT/INR Lab Results  Component Value Date   INR 1.12 11/12/2016   Hepatitis Panel No results for input(s): HEPBSAG, HCVAB, HEPAIGM, HEPBIGM in the last 72 hours. C-Diff No components found for: CDIFF Lipase     Component Value Date/Time   LIPASE 26 11/12/2016 1235    Drugs of Abuse  No results found for: LABOPIA, COCAINSCRNUR, LABBENZ, AMPHETMU, THCU, LABBARB   RADIOLOGY STUDIES: Dg Chest 2 View  Result Date: 11/12/2016 CLINICAL DATA:  Tachycardia, weakness. EXAM: CHEST  2 VIEW COMPARISON:  Radiographs of June 29, 2016. FINDINGS: The heart size and mediastinal contours are within normal limits. Both lungs are clear. No pneumothorax or pleural effusion is noted. The visualized skeletal structures are unremarkable. IMPRESSION: No active cardiopulmonary disease. Electronically Signed   By: Lupita Raider, M.D.   On: 11/12/2016 13:50   Dg Knee 2 Views Right  Result Date: 11/13/2016 CLINICAL DATA:  Acute knee pain.  No known injury. EXAM: RIGHT KNEE - 3 VIEW COMPARISON:  None. FINDINGS: Chondrocalcinosis. No acute bony abnormality. Specifically, no fracture, subluxation, or dislocation. Soft tissues are intact. No joint effusion IMPRESSION: No acute bony abnormality. Electronically Signed   By: Charlett Nose M.D.   On: 11/13/2016 08:16     IMPRESSION:   *  Normocytic anemia. Patient is folate deficient.  S/p transfusion with 1of 2 PRBCs that have been ordered.  Folate deficient and oral folic acid in place.   *  FOBT positive with reports of dark stools as well as Stark and bloody emesis. Rule out ulcer disease. rule out cirrhosis with portal hypertension or variceal source for bleeding. Rule out AVMs. Rule out alcoholic gastritis. Currently  being covered with Protonix and octreotide drip. Rocephin in place in case he has cirrhosis.  *  ETOH hepatitis, recurrent.  History of fatty liver. Question has this progressed to cirrhosis.    *  Thrombocytopenia.  PT/INR normal.  *  Type 1 DM.  Several recent hypoglycemic events. Patient removed his insulin pump in frustration last week.  *  AMS.  Mom reports pt confusion in recent weeks.     PLAN:     *  Obtain ammonia level at next lab draw with Hgb/hct.    *  Hold off on transfusing PRBC unit # 2.  If he is bleeding from varices or portal htn it is best to stay lean as concerns transfusion blood products. Hgb/hct ordered to follow up the transfusion.   *  Will need EGD once sugars stable.  It has been set up for tomorrow.   Discussed with his mom, and she is ok with this plan so long as his blood sugars have normalized.      Jennye Moccasin  11/13/2016, 9:39 AM Pager: [phone]   ________________________________________________________________________  [facility] MD note:  I personally examined the patient, reviewed the data and agree with the assessment and plan described above. Significant drop in Hb from 12 to 7 over the past 4 months, alcoholic hepatitis, dark stools and possible hematemesis.  Planning on EGD tomorrow; for now continue octreotide, protonix IV, transfuse PRN.   Rob Bunting, MD [facility] Pager [phone]

## 2016-11-13 NOTE — Progress Notes (Signed)
Report given to 4N RN.

## 2016-11-13 NOTE — Progress Notes (Signed)
Hypoglycemic Event  CBG: LOWl  Treatment: D50 IV 25 mL  Symptoms: None  Follow-up CBG: Time:427 CBG Result:61  Possible Reasons for Event: Inadequate meal intake    Comments/MD notified:0430    Brett FairyKaren V Darnetta Kesselman

## 2016-11-13 NOTE — Progress Notes (Signed)
PROGRESS NOTE    Jorge ChimesJoseph Pasqual  ZOX:[id number] DOB: 03/27/1966 DOA: 11/12/2016 PCP: Lavell IslamLOWARD,DAVIS L, MD   Outpatient Specialists:     Brief Narrative:  Jorge Baker is a 51 y.o. male with medical history significant of DM with resultant visual impairment, osteoporosis and ETOH abuse presenting because he feels like "something is sucking the life out of me."    Has been feeling bad for the last 4-5 days.  Noticed lack of energy first.  Then total loss of energy.  Unable to stand because he is so weak.  No SOB.  Whole body hurts, mostly the last 3 days.  Chronic cough, no change fgrom baseline.  No fevers.  No abdominal pain.  Doesn't look at stools, has not noticed color change.  Has had loose stools for over a year.  +n/v for a week or more, denies blood in it.  For pain, takes oxycodone if it is really bad; if not too bad he just drinks more alcohol.  Drinks 1/4-1/2 cup of alcohol all day every day.  Prefers bourbon whiskey.  Cannot remember the last time he did not drink.  Denies h/o withdrawals/seizures.  Denies bleeding.  [facility], his right knee is killing him; this was acute in onset and is ongoing.   Assessment & Plan:   Principal Problem:   Symptomatic anemia Active Problems:   Upper GI bleeding   Diabetes mellitus, type 2 (HCC)   Alcohol abuse   Right knee pain   Symptomatic anemia, likely related to upper GI bleeding -Hgb 7.0, normocytic -Folate deficiency -Heme positive stools -Patient's weakness and fatigue are most likely caused by anemia secondary to upper GI bleeding.  -plan for EGD in AM -Will start on both Octreotide and Protonix drips -Will start Rocephin empirically for prophylaxis - Zofran IV for nausea - Avoid NSAIDs and SQ heparin - Maintain IV access (2 large bore IVs if possible). - Monitor closely and follow cbc, transfuse as necessary. - IV Rocephin 1 g X 7 days for PPx  -Will add propranolol, which may also help tachycardia; if no  varices seen on EGD, can discontinue  ETOH dependence -Patient with chronic ETOH dependence -Has failed rehab in the past -Currently no idea when his last day of non-drinking was -He is at high risk for complications of withdrawal including seizures, DTs -CIWA protocol -AST 181, ALT 68 - Elevated LFTs are likely related to alcoholism -Thiamine and folate ordered -Folate deficient, will need on an ongoing basis  DM- type 1 -decreased Lantus while NPO -Last A1c was 5.1 in 2/17 -Will cover with SSI -on D51/2 NS for hypoglycemia  Knee pain -He reports acute onset of [facility] -x ray normal    DVT prophylaxis:  SCD's  Code Status: Full Code   Family Communication: patient  Disposition Plan:     Consultants:   GI     Subjective: Blood sugars low this AM  Objective: Vitals:   11/13/16 0009 11/13/16 0316 11/13/16 0530 11/13/16 0547  BP: 128/71 (!) 145/76 112/66   Pulse: (!) 133 85 81   Resp: 18 20 20    Temp: 98.8 F (37.1 C) 98 F (36.7 C) 97.4 F (36.3 C)   TempSrc: Oral Oral Oral   SpO2: 95% 97% 99% 99%  Weight:      Height:        Intake/Output Summary (Last 24 hours) at 11/13/16 1233 Last data filed at 11/13/16 0530  Gross per 24  hour  Intake             2345 ml  Output                0 ml  Net             2345 ml   Filed Weights   11/12/16 1210 11/12/16 1808  Weight: 53.5 kg (118 lb) 54.6 kg (120 lb 5.9 oz)    Examination:  General exam: Appears calm and comfortable  Respiratory system: Clear to auscultation. Respiratory effort normal. Cardiovascular system: S1 & S2 heard, RRR. No JVD, murmurs, rubs, gallops or clicks. No pedal edema. Gastrointestinal system: Abdomen is nondistended, soft and nontender. No organomegaly or masses felt. Normal bowel sounds heard. Central nervous system: Alert-No focal neurological deficits.     Data Reviewed: I have personally reviewed following labs and imaging  studies  CBC:  Recent Labs Lab 11/12/16 1235  WBC 7.4  NEUTROABS 6.1  HGB 7.0*  HCT 20.2*  MCV 90.6  PLT 73*   Basic Metabolic Panel:  Recent Labs Lab 11/12/16 1235  NA 133*  K 4.3  CL 99*  CO2 19*  GLUCOSE 91  BUN 20  CREATININE 0.53*  CALCIUM 7.9*   GFR: Estimated Creatinine Clearance: 86.3 mL/min (by C-G formula based on SCr of 0.53 mg/dL (L)). Liver Function Tests:  Recent Labs Lab 11/12/16 1235  AST 181*  ALT 68*  ALKPHOS 306*  BILITOT 1.0  PROT 5.6*  ALBUMIN 2.2*    Recent Labs Lab 11/12/16 1235  LIPASE 26   No results for input(s): AMMONIA in the last 168 hours. Coagulation Profile:  Recent Labs Lab 11/12/16 2007  INR 1.12   Cardiac Enzymes: No results for input(s): CKTOTAL, CKMB, CKMBINDEX, TROPONINI in the last 168 hours. BNP (last 3 results) No results for input(s): PROBNP in the last 8760 hours. HbA1C: No results for input(s): HGBA1C in the last 72 hours. CBG:  Recent Labs Lab 11/13/16 0504 11/13/16 0617 11/13/16 0822 11/13/16 1002 11/13/16 1149  GLUCAP 64* 107* 39* 45* 38*   Lipid Profile: No results for input(s): CHOL, HDL, LDLCALC, TRIG, CHOLHDL, LDLDIRECT in the last 72 hours. Thyroid Function Tests: No results for input(s): TSH, T4TOTAL, FREET4, T3FREE, THYROIDAB in the last 72 hours. Anemia Panel:  Recent Labs  11/12/16 1235 11/12/16 1455  VITAMINB12  --  696  FOLATE  --  1.7*  FERRITIN  --  1,611*  TIBC  --  168*  IRON  --  145  RETICCTPCT 1.9  --    Urine analysis:    Component Value Date/Time   COLORURINE AMBER (A) 11/12/2016 1455   APPEARANCEUR CLEAR 11/12/2016 1455   LABSPEC 1.015 11/12/2016 1455   PHURINE 6.0 11/12/2016 1455   GLUCOSEU NEGATIVE 11/12/2016 1455   HGBUR TRACE (A) 11/12/2016 1455   BILIRUBINUR NEGATIVE 11/12/2016 1455   KETONESUR 15 (A) 11/12/2016 1455   PROTEINUR NEGATIVE 11/12/2016 1455   NITRITE NEGATIVE 11/12/2016 1455   LEUKOCYTESUR NEGATIVE 11/12/2016 1455     )No  results found for this or any previous visit (from the past 240 hour(s)).    Anti-infectives    Start     Dose/Rate Route Frequency Ordered Stop   11/12/16 2300  cefTRIAXone (ROCEPHIN) 2 g in dextrose 5 % 50 mL IVPB     2 g 100 mL/hr over 30 Minutes Intravenous Every 24 hours 11/12/16 2254         Radiology Studies: Dg Chest 2 View  Result Date: 11/12/2016 CLINICAL DATA:  Tachycardia, weakness. EXAM: CHEST  2 VIEW COMPARISON:  Radiographs of June 29, 2016. FINDINGS: The heart size and mediastinal contours are within normal limits. Both lungs are clear. No pneumothorax or pleural effusion is noted. The visualized skeletal structures are unremarkable. IMPRESSION: No active cardiopulmonary disease. Electronically Signed   By: Lupita Raider, M.D.   On: 11/12/2016 13:50   Dg Knee 2 Views Right  Result Date: 11/13/2016 CLINICAL DATA:  Acute knee pain.  No known injury. EXAM: RIGHT KNEE - 3 VIEW COMPARISON:  None. FINDINGS: Chondrocalcinosis. No acute bony abnormality. Specifically, no fracture, subluxation, or dislocation. Soft tissues are intact. No joint effusion IMPRESSION: No acute bony abnormality. Electronically Signed   By: Charlett Nose M.D.   On: 11/13/2016 08:16        Scheduled Meds: . cefTRIAXone (ROCEPHIN)  IV  2 g Intravenous Q24H  . folic acid  1 mg Oral Daily  . insulin aspart  0-15 Units Subcutaneous TID WC  . insulin aspart  0-5 Units Subcutaneous QHS  . insulin glargine  12 Units Subcutaneous QHS  . multivitamin with minerals  1 tablet Oral Daily  . [START ON 11/16/2016] pantoprazole  40 mg Intravenous Q12H  . propranolol  10 mg Oral TID  . sodium chloride flush  3 mL Intravenous Q12H  . thiamine  100 mg Oral Daily   Or  . thiamine  100 mg Intravenous Daily   Continuous Infusions: . dextrose 5 % and 0.45% NaCl 100 mL/hr at 11/13/16 0930  . octreotide  (SANDOSTATIN)    IV infusion 50 mcg/hr (11/13/16 0003)  . pantoprozole (PROTONIX) infusion 8 mg/hr (11/13/16  1229)     LOS: 0 days    Time spent: 25 min    JESSICA U VANN, DO [facility] Pager [phone]  If 7PM-7AM, please contact night-coverage [email or web address] Password [facility] 11/13/2016, 12:34 PM

## 2016-11-13 NOTE — Care Management Obs Status (Signed)
MEDICARE OBSERVATION STATUS NOTIFICATION   Patient Details  Name: Jorge Baker MRN: [id number] Date of Birth: 01/31/1966   Medicare Observation Status Notification Given:  Yes    Lawerance Sabalebbie Thorin Starner, RN 11/13/2016, 1:35 PM

## 2016-11-13 NOTE — Progress Notes (Signed)
Hypoglycemic Event  CBG: 39  Treatment: D50 IV 25 mL  Symptoms: Nervous/irritable  Follow-up CBG: Time:1002 CBG Result:45  Treatment: D50 IV 25 mL  Possible Reasons for Event: Inadequate meal intake  Comments/MD notified: Dr Benjamine MolaVann aware; Inreased IVF D5 1/2NS from 175ml/hr to 100/hr    Jetta LoutSarah G Lety Cullens

## 2016-11-13 NOTE — Progress Notes (Signed)
Consent for administer of PRBC signed by patient, blood picked up and started at 3:01.  No complaints of additional pain. After 15 minutes vital signs taken again. VSS. On assessment of IVsight it appeared  To be sligtly swollen, spoke to charge RN, and another RN assessed the sight and did not see any swelling to site. Will continue to monitor.

## 2016-11-13 NOTE — Progress Notes (Signed)
Hypoglycemic Event  CBG: 38  Treatment: D50 IV 50 mL  Symptoms: None  Follow-up CBG: Time:1320 CBG Result:105  Possible Reasons for Event: Unknown  Comments/MD notified: Dr Benjamine MolaVann aware. Will continue to monitor.    Jetta LoutSarah G Sinclair Arrazola

## 2016-11-13 NOTE — Progress Notes (Signed)
Natalia LeatherwoodKatherine called me and we discussed patient hypoglycemic event. Initially reading Low, 25 ml of D50 given IV, cbg came up to 61, another 25 cc of D50 administered and cbg registered at 64.  Another 25 CC administered and repeat CBG came up to 107. Katherine placed orders to change NS to D%.45 ns to be administered at 75 cc.

## 2016-11-13 NOTE — Progress Notes (Addendum)
Inpatient Diabetes Program Recommendations  AACE/ADA: New Consensus Statement on Inpatient Glycemic Control (2015)  Target Ranges:  Prepandial:   less than 140 mg/dL      Peak postprandial:   less than 180 mg/dL (1-2 hours)      Critically ill patients:  140 - 180 mg/dL   Lab Results  Component Value Date   GLUCAP 105 (H) 11/13/2016    Review of Glycemic Control  Diabetes history: DM1 Outpatient Diabetes medications:     Lantus 12 units QHS,     Humalog 0-10 units SSI Current orders for Inpatient glycemic control:     NPO    Moderate correction scale Novolog 0-15 units TIDAC and 0-5 units QHS    Lantus 5 units QHS  Inpatient Diabetes Program Recommendations:     Please consider decreasing to Sensitive correction scale Novolog 0-9 units TIDAC and 0-5 units QHS.    When patient begins to eat, please consider Meal Coverage of Novolog 3 units TIDAC if patient eats > 50% of meal.    Per ADA recommendations "consider performing an A1C on all patients with diabetes or hyperglycemia admitted to the hospital if not performed in the prior 3 months".  Thank you,  Kristine LineaKaren Saramarie Stinger, RN, MSN Diabetes Coordinator Inpatient Diabetes Program [phone] (Team Pager)

## 2016-11-13 NOTE — Progress Notes (Signed)
Received report from Jorge Baker.  Patient lying in bed, bed alarm on and appearing very uncomfortable.  Refused skin check. Visible rash observed on Face, head, chest and back.  When questioned about this patient was poor historian; When asked if he had been to a dermatologist he answered "yes, but what he gave me made it worse"   At this time, there are no orders. Dr Ophelia CharterYates in to see patient.. Patient is safe with bed alarm on, side rails up X2. IV sight on R FA assessed, flushed and saline locked. Started two IVs . Patient moaning constantly, crys out at the slightest touch. Explained plan of care and pain management to patient.

## 2016-11-14 ENCOUNTER — Inpatient Hospital Stay (HOSPITAL_COMMUNITY): Payer: Medicare HMO | Admitting: Anesthesiology

## 2016-11-14 ENCOUNTER — Encounter (HOSPITAL_COMMUNITY): Payer: Self-pay | Admitting: Anesthesiology

## 2016-11-14 ENCOUNTER — Encounter (HOSPITAL_COMMUNITY)
Admission: EM | Disposition: A | Discharge status: Skilled Nursing Facility | Payer: Self-pay | Source: Home / Self Care | Attending: Internal Medicine

## 2016-11-14 DIAGNOSIS — K2990 Gastroduodenitis, unspecified, without bleeding: Secondary | ICD-10-CM

## 2016-11-14 DIAGNOSIS — K2970 Gastritis, unspecified, without bleeding: Secondary | ICD-10-CM | POA: Diagnosis present

## 2016-11-14 HISTORY — PX: ESOPHAGOGASTRODUODENOSCOPY (EGD) WITH PROPOFOL: SHX5813

## 2016-11-14 LAB — URINE CULTURE

## 2016-11-14 LAB — GLUCOSE, CAPILLARY
GLUCOSE-CAPILLARY: 89 mg/dL (ref 65–99)
GLUCOSE-CAPILLARY: 247 mg/dL — AB (ref 65–99)
GLUCOSE-CAPILLARY: 98 mg/dL (ref 65–99)
GLUCOSE-CAPILLARY: 136 mg/dL — AB (ref 65–99)
GLUCOSE-CAPILLARY: 60 mg/dL — AB (ref 65–99)
GLUCOSE-CAPILLARY: 96 mg/dL (ref 65–99)
GLUCOSE-CAPILLARY: 112 mg/dL — AB (ref 65–99)
GLUCOSE-CAPILLARY: 120 mg/dL — AB (ref 65–99)
GLUCOSE-CAPILLARY: 158 mg/dL — AB (ref 65–99)
GLUCOSE-CAPILLARY: 201 mg/dL — AB (ref 65–99)
Glucose-Capillary: 52 mg/dL — ABNORMAL LOW (ref 65–99)
Glucose-Capillary: 103 mg/dL — ABNORMAL HIGH (ref 65–99)
Glucose-Capillary: 96 mg/dL (ref 65–99)
Glucose-Capillary: 102 mg/dL — ABNORMAL HIGH (ref 65–99)
Glucose-Capillary: 94 mg/dL (ref 65–99)
Glucose-Capillary: 277 mg/dL — ABNORMAL HIGH (ref 65–99)

## 2016-11-14 LAB — CBC
HCT: 20.6 % — ABNORMAL LOW (ref 39.0–52.0)
Hemoglobin: 7.2 g/dL — ABNORMAL LOW (ref 13.0–17.0)
MCH: 31.3 pg (ref 26.0–34.0)
MCHC: 35 g/dL (ref 30.0–36.0)
MCV: 89.6 fL (ref 78.0–100.0)
PLATELETS: 72 10*3/uL — AB (ref 150–400)
RBC: 2.3 MIL/uL — ABNORMAL LOW (ref 4.22–5.81)
RDW: 17 % — AB (ref 11.5–15.5)
WBC: 6.5 10*3/uL (ref 4.0–10.5)

## 2016-11-14 LAB — COMPREHENSIVE METABOLIC PANEL
ALT: 61 U/L (ref 17–63)
ANION GAP: 11 (ref 5–15)
AST: 209 U/L — ABNORMAL HIGH (ref 15–41)
Albumin: 1.8 g/dL — ABNORMAL LOW (ref 3.5–5.0)
Alkaline Phosphatase: 233 U/L — ABNORMAL HIGH (ref 38–126)
BUN: 10 mg/dL (ref 6–20)
CALCIUM: 7.2 mg/dL — AB (ref 8.9–10.3)
CO2: 22 mmol/L (ref 22–32)
Chloride: 101 mmol/L (ref 101–111)
Creatinine, Ser: 0.6 mg/dL — ABNORMAL LOW (ref 0.61–1.24)
Glucose, Bld: 81 mg/dL (ref 65–99)
Potassium: 3.8 mmol/L (ref 3.5–5.1)
SODIUM: 134 mmol/L — AB (ref 135–145)
Total Bilirubin: 1 mg/dL (ref 0.3–1.2)
Total Protein: 4.8 g/dL — ABNORMAL LOW (ref 6.5–8.1)

## 2016-11-14 LAB — MRSA PCR SCREENING: MRSA by PCR: NEGATIVE

## 2016-11-14 LAB — HEMOGLOBIN A1C
Hgb A1c MFr Bld: 6.4 % — ABNORMAL HIGH (ref 4.8–5.6)
Mean Plasma Glucose: 137 mg/dL

## 2016-11-14 SURGERY — ESOPHAGOGASTRODUODENOSCOPY (EGD) WITH PROPOFOL
Anesthesia: Monitor Anesthesia Care

## 2016-11-14 MED ORDER — LACTATED RINGERS IV SOLN
INTRAVENOUS | Status: DC | PRN
  Administered 2016-11-14: 12:00:00 via INTRAVENOUS

## 2016-11-14 MED ORDER — MIDAZOLAM HCL 2 MG/2ML IJ SOLN
INTRAMUSCULAR | Status: DC | PRN
  Administered 2016-11-14: 2 mg via INTRAVENOUS

## 2016-11-14 MED ORDER — PEG-KCL-NACL-NASULF-NA ASC-C 100 G PO SOLR
0.5000 | Freq: Once | ORAL | Status: AC
  Administered 2016-11-14: 100 g via ORAL
  Filled 2016-11-14: qty 1

## 2016-11-14 MED ORDER — DEXTROSE 50 % IV SOLN
INTRAVENOUS | Status: AC
  Administered 2016-11-14: 25 mL
  Filled 2016-11-14: qty 50

## 2016-11-14 MED ORDER — METOCLOPRAMIDE HCL 5 MG/ML IJ SOLN
10.0000 mg | Freq: Once | INTRAMUSCULAR | Status: AC
  Administered 2016-11-15: 10 mg via INTRAVENOUS
  Filled 2016-11-14: qty 2

## 2016-11-14 MED ORDER — PROPOFOL 500 MG/50ML IV EMUL
INTRAVENOUS | Status: DC | PRN
  Administered 2016-11-14: 25 ug/kg/min via INTRAVENOUS

## 2016-11-14 MED ORDER — PANTOPRAZOLE SODIUM 40 MG PO TBEC: 40.0000 mg | DELAYED_RELEASE_TABLET | Freq: Every day | ORAL | Status: DC

## 2016-11-14 MED ORDER — PEG-KCL-NACL-NASULF-NA ASC-C 100 G PO SOLR
0.5000 | Freq: Once | ORAL | Status: AC
  Administered 2016-11-15: 100 g via ORAL

## 2016-11-14 MED ORDER — METOCLOPRAMIDE HCL 5 MG/ML IJ SOLN
10.0000 mg | Freq: Once | INTRAMUSCULAR | Status: AC
  Administered 2016-11-14: 10 mg via INTRAVENOUS
  Filled 2016-11-14: qty 2

## 2016-11-14 MED ORDER — PEG-KCL-NACL-NASULF-NA ASC-C 100 G PO SOLR: 1.0000 | Freq: Once | ORAL | Status: DC

## 2016-11-14 MED ORDER — BISACODYL 5 MG PO TBEC
10.0000 mg | DELAYED_RELEASE_TABLET | Freq: Once | ORAL | Status: AC
  Administered 2016-11-14: 10 mg via ORAL
  Filled 2016-11-14: qty 2

## 2016-11-14 MED ORDER — PANTOPRAZOLE SODIUM 40 MG PO TBEC
40.0000 mg | DELAYED_RELEASE_TABLET | Freq: Every day | ORAL | Status: DC
  Administered 2016-11-15 – 2016-11-20 (×5): 40 mg via ORAL
  Filled 2016-11-14 (×7): qty 1

## 2016-11-14 MED ORDER — RIFAXIMIN 550 MG PO TABS
550.0000 mg | ORAL_TABLET | Freq: Two times a day (BID) | ORAL | Status: DC
  Administered 2016-11-14: 550 mg via ORAL
  Filled 2016-11-14: qty 1

## 2016-11-14 MED ORDER — LACTULOSE 10 GM/15ML PO SOLN
20.0000 g | Freq: Two times a day (BID) | ORAL | Status: DC
Start: 2016-11-14 — End: 2016-11-16
  Administered 2016-11-14 – 2016-11-16 (×3): 20 g via ORAL
  Filled 2016-11-14 (×3): qty 30

## 2016-11-14 NOTE — H&P (View-Only) (Signed)
[facility] Consult: 9:39 AM 11/13/2016  LOS: 0 days    Referring Provider: Dr Benjamine MolaVann  Primary Care Physician:  Lavell IslamLOWARD,DAVIS L, MD Primary Gastroenterologist:  Gentry FitzUnassigned.    Quinn AxeBrian Smith at [facility] [place].  Marland Kitchen.     Reason for Consultation:  Anemia. FOBT +/      HPI: Jorge Baker is a 51 y.o. male.  PMH type 1 DM since age 51.  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  Alcoholism and alcoholic hepatitis in 2015 and 06/2016.  f-actin Ab normal in 02/2014. Fatty liver per ultrasounds in 2015 and 06/2016.  Thrombocytopenia (81K) and hyponatremia (129) per 06/2016 labs.  No diagnosis of liver disease.  No prior history of upper endoscopy or colonoscopy.  No previous history of anemia.   Pt is very lethargic and unable to answer even most simple questions, perhaps due to blood sugars in the 30s and 40s.  So hx obtained by phone conversation with his mother.  Patient re-stuck himself for insulin pump access last Monday, this is done every 3 days. He got so frustrated with the insulin pump that on Tuesday he pulled it out and resorted to administrating insulin via syringe.   He had been having and continued to have difficulty regulating his blood sugars and his mother says that he would see numbers up into the 170s and feel that his sugar was low and readings in the 20's and 30s  would be interpreted by him as being high. On at least one occasion last week his mother had to mix up a sugar solution because of glucose in the 20s.  He has been progressively weaker and more lethargic for at least a week. He is hardly able to stand up. He is living in a out building at his mother's house that has no running water, his wishes.  Mom  emptied black loose stool from bedside commode last week. His mother hasn't emptied the commode since and so black stool may be ongoing. He vomited recently and there was blood in the vomit.  Mom saw some dark emesis a couple of weeks ago.  Mother also tells me that he hardly is eating, in fact last week there were 2 or 3 days where she doesn't think he ate anything.    At arrival to ED he reported total body aches/pains, no change in cough.  Patient unable to tell me if he's been using any NSAIDs or ASA products.  Consumes 1.75 liters of bourbon every 10 to 14 days.   Hgb 7.  Was 12 in 06/2016.  MCV 90.  Platelets 73.  coags normal.  Iron ok, folate low. AST/ALT 181/68. Alkaline phosphatase 306. Total bilirubin normal at 1. Octreotide, Protonix drip started.  RN says that stool this AM was soft, dark but not melenic or grossly bloody.     Past Medical History:  Diagnosis Date  . Diabetes mellitus without complication (HCC)   . ETOH abuse   .  Osteoporosis   . 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 EYE SURGERY      Prior to Admission medications   Medication Sig Start Date End Date Taking? Authorizing Provider  insulin glargine (LANTUS) 100 UNIT/ML injection Inject 12 Units into the skin at bedtime.   Yes Historical Provider, MD  insulin lispro (HUMALOG) 100 UNIT/ML injection Inject 10 Units into the skin daily. Per sliding scale   Yes Historical Provider, MD  oxyCODONE-acetaminophen (PERCOCET/ROXICET) 5-325 MG tablet Take 2 tablets by mouth every 4 (four) hours as needed. 09/02/15   Rolland Porter, MD    Scheduled Meds: . cefTRIAXone (ROCEPHIN)  IV  2 g Intravenous Q24H  . folic acid  1 mg Oral Daily  . insulin aspart  0-15 Units Subcutaneous TID WC  . insulin aspart  0-5 Units Subcutaneous QHS  . insulin glargine  12 Units Subcutaneous QHS  . multivitamin with minerals  1 tablet Oral Daily  . [START ON 11/16/2016]  pantoprazole  40 mg Intravenous Q12H  . propranolol  10 mg Oral TID  . sodium chloride flush  3 mL Intravenous Q12H  . thiamine  100 mg Oral Daily   Or  . thiamine  100 mg Intravenous Daily   Infusions: . dextrose 5 % and 0.45% NaCl 1,000 mL (11/13/16 0621)  . octreotide  (SANDOSTATIN)    IV infusion 50 mcg/hr (11/13/16 0003)  . pantoprozole (PROTONIX) infusion 8 mg/hr (11/12/16 2142)   PRN Meds: acetaminophen **OR** acetaminophen, LORazepam **OR** LORazepam, morphine injection, ondansetron **OR** ondansetron (ZOFRAN) IV   Allergies as of 11/12/2016  . (No Known Allergies)    Family History  Problem Relation Age of Onset  . CVA Father     Social History   Social History  . Marital status: Divorced    Spouse name: N/A  . Number of children: N/A  . Years of education: N/A   Occupational History  . disabled    Social History Main Topics  . Smoking status: Current Every Day Smoker    Packs/day: 1.25    Years: 36.00    Types: Cigarettes  . Smokeless tobacco: Never Used  . Alcohol use Yes     Comment: see HPI  . Drug use: No  . Sexual activity: No   Other Topics Concern  . Not on file   Social History Narrative  . No narrative on file    REVIEW OF SYSTEMS: Constitutional:  Per HPI ENT:  No nose bleeds Pulm:  Cough, chronic CV:  No palpitations, no LE edema.  No chest pain GU:  No hematuria, no frequency GI:  Per HPI Heme:  Denies unusual bleeding or bruising.   Transfusions:  none Neuro:  No headaches, no peripheral tingling or numbness Derm:  Seen by a dermatologist in December but missed follow-up appointment in January. This regarding rash on his trunk and face. He was given an oral antibiotic which he discontinued and a topical cream. Endocrine:  No sweats or chills.  No polyuria or dysuria Immunization:  Not clarified Travel:  None beyond local counties in last few months.    PHYSICAL EXAM: Vital signs in last 24 hours: Vitals:   11/13/16 0316  11/13/16 0530  BP: (!) 145/76 112/66  Pulse: 85 81  Resp: 20 20  Temp: 98 F (36.7 C) 97.4 F (36.3 C)   Wt Readings from Last 3 Encounters:  11/12/16 54.6 kg (120 lb  5.9 oz)  06/29/16 56.2 kg (124 lb)  09/02/15 61.7 kg (136 lb)    General: Unwell, somewhat cushingoid appearing.  Somnolent  Head:  No asymmetry. Rash across the face with hyperpigmentation.  Eyes:  No scleral icterus. No conjunctival pallor. Ears:  Unable to truly assess hearing due to altered mental status. He does respond to normal volume voice  Nose:  No congestion or discharge Mouth:  Mucosa pink. Patient wouldn't open his mouth. Neck:  No mass, no JVD, no thyromegaly. Lungs:  Clear bilaterally. No labored breathing. Heart: RRR. No MRG. S1, S2 present Abdomen:  Protuberant, soft. No HSM, masses, bruits. Small umbilical hernia. GU:  No scrotal edema..   Rectal: Deferred   Musc/Skeltl: No gross joint contracture deformities or swelling or erythema.  Muscles in the legs are moderately atrophic Extremities:  No CCE.  Neurologic:  Patient wouldn't answer many questions his mentation is very slow. He is able to tell me that he is incontinent hospital and response to his name but could not give me the year. Skin:  Hyperpigmented splotches and some erythema on the face and trunk. Nodes:  No cervical adenopathy.   Psych:  Very flat affect.  Intake/Output from previous day: 02/04 0701 - 02/05 0700 In: 2345 [I.V.:10; Blood:335; IV Piggyback:2000] Out: -  Intake/Output this shift: No intake/output data recorded.  LAB RESULTS:  Recent Labs  11/12/16 1235  WBC 7.4  HGB 7.0*  HCT 20.2*  PLT 73*   BMET Lab Results  Component Value Date   NA 133 (L) 11/12/2016   NA 129 (L) 06/29/2016   K 4.3 11/12/2016   K 4.3 06/29/2016   CL 99 (L) 11/12/2016   CL 96 (L) 06/29/2016   CO2 19 (L) 11/12/2016   CO2 23 06/29/2016   GLUCOSE 91 11/12/2016   GLUCOSE 92 06/29/2016   BUN 20 11/12/2016   BUN 5 (L) 06/29/2016    CREATININE 0.53 (L) 11/12/2016   CREATININE 0.60 (L) 06/29/2016   CALCIUM 7.9 (L) 11/12/2016   CALCIUM 8.1 (L) 06/29/2016   LFT  Recent Labs  11/12/16 1235  PROT 5.6*  ALBUMIN 2.2*  AST 181*  ALT 68*  ALKPHOS 306*  BILITOT 1.0   PT/INR Lab Results  Component Value Date   INR 1.12 11/12/2016   Hepatitis Panel No results for input(s): HEPBSAG, HCVAB, HEPAIGM, HEPBIGM in the last 72 hours. C-Diff No components found for: CDIFF Lipase     Component Value Date/Time   LIPASE 26 11/12/2016 1235    Drugs of Abuse  No results found for: LABOPIA, COCAINSCRNUR, LABBENZ, AMPHETMU, THCU, LABBARB   RADIOLOGY STUDIES: Dg Chest 2 View  Result Date: 11/12/2016 CLINICAL DATA:  Tachycardia, weakness. EXAM: CHEST  2 VIEW COMPARISON:  Radiographs of June 29, 2016. FINDINGS: The heart size and mediastinal contours are within normal limits. Both lungs are clear. No pneumothorax or pleural effusion is noted. The visualized skeletal structures are unremarkable. IMPRESSION: No active cardiopulmonary disease. Electronically Signed   By: Lupita Raider, M.D.   On: 11/12/2016 13:50   Dg Knee 2 Views Right  Result Date: 11/13/2016 CLINICAL DATA:  Acute knee pain.  No known injury. EXAM: RIGHT KNEE - 3 VIEW COMPARISON:  None. FINDINGS: Chondrocalcinosis. No acute bony abnormality. Specifically, no fracture, subluxation, or dislocation. Soft tissues are intact. No joint effusion IMPRESSION: No acute bony abnormality. Electronically Signed   By: Charlett Nose M.D.   On: 11/13/2016 08:16     IMPRESSION:   *  Normocytic anemia. Patient is folate deficient.  S/p transfusion with 1of 2 PRBCs that have been ordered.  Folate deficient and oral folic acid in place.   *  FOBT positive with reports of dark stools as well as Stark and bloody emesis. Rule out ulcer disease. rule out cirrhosis with portal hypertension or variceal source for bleeding. Rule out AVMs. Rule out alcoholic gastritis. Currently  being covered with Protonix and octreotide drip. Rocephin in place in case he has cirrhosis.  *  ETOH hepatitis, recurrent.  History of fatty liver. Question has this progressed to cirrhosis.    *  Thrombocytopenia.  PT/INR normal.  *  Type 1 DM.  Several recent hypoglycemic events. Patient removed his insulin pump in frustration last week.  *  AMS.  Mom reports pt confusion in recent weeks.     PLAN:     *  Obtain ammonia level at next lab draw with Hgb/hct.    *  Hold off on transfusing PRBC unit # 2.  If he is bleeding from varices or portal htn it is best to stay lean as concerns transfusion blood products. Hgb/hct ordered to follow up the transfusion.   *  Will need EGD once sugars stable.  It has been set up for tomorrow.   Discussed with his mom, and she is ok with this plan so long as his blood sugars have normalized.      Jennye Moccasin  11/13/2016, 9:39 AM Pager: [phone]   ________________________________________________________________________  [facility] MD note:  I personally examined the patient, reviewed the data and agree with the assessment and plan described above. Significant drop in Hb from 12 to 7 over the past 4 months, alcoholic hepatitis, dark stools and possible hematemesis.  Planning on EGD tomorrow; for now continue octreotide, protonix IV, transfuse PRN.   Rob Bunting, MD [facility] Pager [phone]

## 2016-11-14 NOTE — Progress Notes (Signed)
Hypoglycemic Event  CBG:52  Treatment: D50 IV 25 mL  Symptoms: None  Follow-up CBG: Time:0430 CBG Result:102  Possible Reasons for Event: Unknown  Comments/MD notified:Kirby    Gaetano NetSara E Gonthier

## 2016-11-14 NOTE — Care Management Note (Signed)
Case Management Note  Patient Details  Name: Jorge Baker MRN: [id number] Date of Birth: 03/23/1966  Subjective/Objective:    Adm w symptomatic anemia, etoh use                Action/Plan:lives w parents  Expected Discharge Date:                  Expected Discharge Plan:  Home/Self Care  In-House Referral:  Clinical Social Work  Discharge planning Services     Post Acute Care Choice:    Choice offered to:     DME Arranged:    DME Agency:     HH Arranged:    HH Agency:     Status of Service:  In process, will continue to follow  If discussed at Long Length of Stay Meetings, dates discussed:    Additional Comments:will moniter for dc needs as pt progresses.  Hanley Haysowell, Marisa Hufstetler T, RN 11/14/2016, 9:03 AM

## 2016-11-14 NOTE — Anesthesia Preprocedure Evaluation (Addendum)
Anesthesia Evaluation  Patient identified by MRN, date of birth, ID band Patient confused    Reviewed: Allergy & Precautions, Patient's Chart, lab work & pertinent test results  History of Anesthesia Complications Negative for: history of anesthetic complications  Airway Mallampati: II  TM Distance: >3 FB Neck ROM: Full    Dental  (+) Edentulous Upper, Edentulous Lower   Pulmonary Current Smoker,    Pulmonary exam normal        Cardiovascular negative cardio ROS Normal cardiovascular exam     Neuro/Psych negative neurological ROS  negative psych ROS   GI/Hepatic negative GI ROS, (+)     substance abuse  alcohol use,   Endo/Other  diabetes  Renal/GU      Musculoskeletal   Abdominal   Peds  Hematology   Anesthesia Other Findings   Reproductive/Obstetrics                            Anesthesia Physical Anesthesia Plan  ASA: III  Anesthesia Plan: MAC   Post-op Pain Management:    Induction:   Airway Management Planned: Natural Airway and Simple Face Mask  Additional Equipment:   Intra-op Plan:   Post-operative Plan:   Informed Consent: I have reviewed the patients History and Physical, chart, labs and discussed the procedure including the risks, benefits and alternatives for the proposed anesthesia with the patient or authorized representative who has indicated his/her understanding and acceptance.   Consent reviewed with POA  Plan Discussed with: CRNA and Anesthesiologist  Anesthesia Plan Comments:        Anesthesia Quick Evaluation

## 2016-11-14 NOTE — Anesthesia Postprocedure Evaluation (Addendum)
Anesthesia Post Note  Patient: Jorge Baker  Procedure(s) Performed: Procedure(s) (LRB): ESOPHAGOGASTRODUODENOSCOPY (EGD) WITH PROPOFOL (N/A)  Patient location during evaluation: Endoscopy Anesthesia Type: MAC Level of consciousness: awake and alert Pain management: pain level controlled Vital Signs Assessment: post-procedure vital signs reviewed and stable Respiratory status: spontaneous breathing and respiratory function stable Cardiovascular status: stable Anesthetic complications: no       Last Vitals:  Vitals:   11/14/16 1323 11/14/16 1508  BP: (!) 156/81 136/89  Pulse: 81 92  Resp: 15 (!) 23  Temp:      Last Pain:  Vitals:   11/14/16 1508  TempSrc:   PainSc: 0-No pain                 Khari Mally DANIEL

## 2016-11-14 NOTE — Progress Notes (Signed)
Hypoglycemic Event  CBG: 60  Treatment: D50 IV 25 mL  Symptoms: None  Follow-up CBG: Time:1110 CBG Result:136  Possible Reasons for Event: Inadequate meal intake and Other: difficulity management of blood sugar since admission   Comments/MD notified:  Endoscopy aware, patient scheduled for Endoscopy, RN called and notified.  They will continue to check blood sugars during procedure      Petar Mucci, Jettie BoozeJonna K

## 2016-11-14 NOTE — Interval H&P Note (Signed)
History and Physical Interval Note:  11/14/2016 12:01 PM  Jorge Baker  has presented today for surgery, with the diagnosis of anemia, FOBT +.  dark emesis  The various methods of treatment have been discussed with the patient and family. After consideration of risks, benefits and other options for treatment, the patient has consented to  Procedure(s): ESOPHAGOGASTRODUODENOSCOPY (EGD) WITH PROPOFOL (N/A) as a surgical intervention .  The patient's history has been reviewed, patient examined, no change in status, stable for surgery.  I have reviewed the patient's chart and labs.  Questions were answered to the patient's satisfaction.     Rachael FeeJacobs, Brennen Camper P

## 2016-11-14 NOTE — Transfer of Care (Signed)
Immediate Anesthesia Transfer of Care Note  Patient: Jorge Baker  Procedure(s) Performed: Procedure(s): ESOPHAGOGASTRODUODENOSCOPY (EGD) WITH PROPOFOL (N/A)  Patient Location: PACU  Anesthesia Type:MAC  Level of Consciousness: patient cooperative and responds to stimulation  Airway & Oxygen Therapy: Patient Spontanous Breathing and Patient connected to nasal cannula oxygen  Post-op Assessment: Report given to RN and Post -op Vital signs reviewed and stable  Post vital signs: Reviewed and stable  Last Vitals:  Vitals:   11/14/16 1133 11/14/16 1303  BP: 115/87 138/79  Pulse: 88 80  Resp: 20 (!) 22  Temp:      Last Pain:  Vitals:   11/14/16 1303  TempSrc: Oral  PainSc:       Patients Stated Pain Goal: 0 (67/01/41 0301)  Complications: No apparent anesthesia complications

## 2016-11-14 NOTE — Progress Notes (Signed)
PROGRESS NOTE    Jorge Baker  YVO:[id number] DOB: 1966/02/15 DOA: 11/12/2016 PCP: Thurman Coyer, MD   Outpatient Specialists:     Brief Narrative:  Jorge Baker is a 51 y.o. male with medical history significant of DM with resultant visual impairment, osteoporosis and ETOH abuse presenting because he feels like "something is sucking the life out of me."    Has been feeling bad for the last 4-5 days.  Noticed lack of energy first.  Then total loss of energy.  Unable to stand because he is so weak.  No SOB.  Whole body hurts, mostly the last 3 days.  Chronic cough, no change fgrom baseline.  No fevers.  No abdominal pain.  Doesn't look at stools, has not noticed color change.  Has had loose stools for over a year.  +n/v for a week or more, denies blood in it.  For pain, takes oxycodone if it is really bad; if not too bad he just drinks more alcohol.  Drinks 1/4-1/2 cup of alcohol all day every day.  Prefers bourbon whiskey.     Assessment & Plan:   Principal Problem:   Symptomatic anemia Active Problems:   Upper GI bleeding   Diabetes mellitus, type 2 (HCC)   Alcohol abuse   Right knee pain   Gastritis and gastroduodenitis   Symptomatic anemia -EGD with gastritis -Heme positive stools- for colonoscopy tomm -change protonix to PO -Will start Rocephin empirically for prophylaxis - Zofran IV for nausea - Maintain IV access (2 large bore IVs if possible).  ETOH dependence with withdrawal -Patient with chronic ETOH dependence -Has failed rehab in the past -Currently no idea when his last day of non-drinking was -He is at high risk for complications of withdrawal including seizures, DTs -CIWA protocol- may need scheduled ativan -Thiamine and folate ordered -Folate deficient, will need on an ongoing basis  DM- type 1 -decreased Lantus while NPO -Last A1c was 5.1 in 2/17 now 6.4 -D10 and SSI  Knee pain -He reports acute onset of knee pain while at [facility] -x ray  normal  Increased ammonia -xifamin added   DVT prophylaxis:  SCD's  Code Status: Full Code   Family Communication: Patient/mother on phone  Disposition Plan:     Consultants:   GI     Subjective: Confused- sideways in bed  Objective: Vitals:   11/14/16 0900 11/14/16 1133 11/14/16 1303 11/14/16 1315  BP: 95/80 115/87 138/79 (!) 149/71  Pulse: (!) 105 88 80 86  Resp: (!) 23 20 (!) 22 17  Temp:      TempSrc:   Oral   SpO2: 100% 100% 99% 100%  Weight:      Height:        Intake/Output Summary (Last 24 hours) at 11/14/16 1317 Last data filed at 11/14/16 1237  Gross per 24 hour  Intake          2837.92 ml  Output              325 ml  Net          2512.92 ml   Filed Weights   11/12/16 1210 11/12/16 1808  Weight: 53.5 kg (118 lb) 54.6 kg (120 lb 5.9 oz)    Examination:  General exam: Appears calm and comfortable  Respiratory system: Clear to auscultation. Respiratory effort normal. Cardiovascular system: S1 & S2 heard, RRR. No JVD, murmurs, rubs, gallops or clicks. No pedal edema. Gastrointestinal system: Abdomen is nondistended, soft and nontender. No organomegaly or  masses felt. Normal bowel sounds heard. Central nervous system: pleasantly confused     Data Reviewed: I have personally reviewed following labs and imaging studies  CBC:  Recent Labs Lab 11/12/16 1235 11/13/16 1250 11/14/16 0242  WBC 7.4 6.8 6.5  NEUTROABS 6.1  --   --   HGB 7.0* 7.3*  7.4* 7.2*  HCT 20.2* 21.5*  21.4* 20.6*  MCV 90.6 90.0 89.6  PLT 73* 60* 72*   Basic Metabolic Panel:  Recent Labs Lab 11/12/16 1235 11/14/16 0242  NA 133* 134*  K 4.3 3.8  CL 99* 101  CO2 19* 22  GLUCOSE 91 81  BUN 20 10  CREATININE 0.53* 0.60*  CALCIUM 7.9* 7.2*   GFR: Estimated Creatinine Clearance: 86.3 mL/min (by C-G formula based on SCr of 0.6 mg/dL (L)). Liver Function Tests:  Recent Labs Lab 11/12/16 1235 11/14/16 0242  AST 181* 209*  ALT 68* 61  ALKPHOS 306*  233*  BILITOT 1.0 1.0  PROT 5.6* 4.8*  ALBUMIN 2.2* 1.8*    Recent Labs Lab 11/12/16 1235  LIPASE 26    Recent Labs Lab 11/13/16 1250  AMMONIA 60*   Coagulation Profile:  Recent Labs Lab 11/12/16 2007 11/13/16 1250  INR 1.12 1.14   Cardiac Enzymes: No results for input(s): CKTOTAL, CKMB, CKMBINDEX, TROPONINI in the last 168 hours. BNP (last 3 results) No results for input(s): PROBNP in the last 8760 hours. HbA1C:  Recent Labs  11/12/16 2148  HGBA1C 6.4*   CBG:  Recent Labs Lab 11/14/16 0752 11/14/16 1051 11/14/16 1110 11/14/16 1210 11/14/16 1311  GLUCAP 102* 60* 136* 94 96   Lipid Profile: No results for input(s): CHOL, HDL, LDLCALC, TRIG, CHOLHDL, LDLDIRECT in the last 72 hours. Thyroid Function Tests: No results for input(s): TSH, T4TOTAL, FREET4, T3FREE, THYROIDAB in the last 72 hours. Anemia Panel:  Recent Labs  11/12/16 1235 11/12/16 1455  VITAMINB12  --  696  FOLATE  --  1.7*  FERRITIN  --  1,611*  TIBC  --  168*  IRON  --  145  RETICCTPCT 1.9  --    Urine analysis:    Component Value Date/Time   COLORURINE AMBER (A) 11/12/2016 1455   APPEARANCEUR CLEAR 11/12/2016 1455   LABSPEC 1.015 11/12/2016 1455   PHURINE 6.0 11/12/2016 1455   GLUCOSEU NEGATIVE 11/12/2016 1455   HGBUR TRACE (A) 11/12/2016 1455   BILIRUBINUR NEGATIVE 11/12/2016 1455   KETONESUR 15 (A) 11/12/2016 1455   PROTEINUR NEGATIVE 11/12/2016 1455   NITRITE NEGATIVE 11/12/2016 1455   LEUKOCYTESUR NEGATIVE 11/12/2016 1455     ) Recent Results (from the past 240 hour(s))  Urine culture     Status: Abnormal   Collection Time: 11/12/16  2:55 PM  Result Value Ref Range Status   Specimen Description URINE, CLEAN CATCH  Final   Special Requests NONE  Final   Culture (A)  Final    <10,000 COLONIES/mL INSIGNIFICANT GROWTH Performed at [facility], [address], [place], [place] [address]    Report Status 11/14/2016 FINAL  Final  MRSA PCR Screening     Status:  None   Collection Time: 11/14/16 12:24 AM  Result Value Ref Range Status   MRSA by PCR NEGATIVE NEGATIVE Final    Comment:        The GeneXpert MRSA Assay (FDA approved for NASAL specimens only), is one component of a comprehensive MRSA colonization surveillance program. It is not intended to diagnose MRSA infection nor to guide or  monitor treatment for MRSA infections.       Anti-infectives    Start     Dose/Rate Route Frequency Ordered Stop   11/14/16 1100  rifaximin (XIFAXAN) tablet 550 mg  Status:  Discontinued     550 mg Oral 2 times daily 11/14/16 0943 11/14/16 1313   11/12/16 2300  cefTRIAXone (ROCEPHIN) 2 g in dextrose 5 % 50 mL IVPB  Status:  Discontinued     2 g 100 mL/hr over 30 Minutes Intravenous Every 24 hours 11/12/16 2254 11/14/16 1312       Radiology Studies: Dg Chest 2 View  Result Date: 11/12/2016 CLINICAL DATA:  Tachycardia, weakness. EXAM: CHEST  2 VIEW COMPARISON:  Radiographs of June 29, 2016. FINDINGS: The heart size and mediastinal contours are within normal limits. Both lungs are clear. No pneumothorax or pleural effusion is noted. The visualized skeletal structures are unremarkable. IMPRESSION: No active cardiopulmonary disease. Electronically Signed   By: James  Green Jr, M.D.   On: 11/12/2016 13:50   Dg Knee 2 Views Right  Result Date: 11/13/2016 CLINICAL DATA:  Acute knee pain.  No known injury. EXAM: RIGHT KNEE - 3 VIEW COMPARISON:  None. FINDINGS: Chondrocalcinosis. No acute bony abnormality. Specifically, no fracture, subluxation, or dislocation. Soft tissues are intact. No joint effusion IMPRESSION: No acute bony abnormality. Electronically Signed   By: Kevin  Dover M.D.   On: 11/13/2016 08:16        Scheduled Meds: . bisacodyl  10 mg Oral Once  . [MAR Hold] folic acid  1 mg Oral Daily  . [MAR Hold] insulin glargine  5 Units Subcutaneous QHS  . metoCLOPramide (REGLAN) injection  10 mg Intravenous Once   Followed by  . [START ON  11/15/2016] metoCLOPramide (REGLAN) injection  10 mg Intravenous Once  . [MAR Hold] multivitamin with minerals  1 tablet Oral Daily  . [START ON 11/15/2016] pantoprazole  40 mg Oral Q0600  . peg 3350 powder  1 kit Oral Once  . [MAR Hold] propranolol  10 mg Oral TID  . [MAR Hold] sodium chloride flush  3 mL Intravenous Q12H  . [MAR Hold] thiamine  100 mg Oral Daily   Or  . [MAR Hold] thiamine  100 mg Intravenous Daily   Continuous Infusions: . dextrose 50 mL/hr at 11/14/16 1100     LOS: 1 day    Time spent: 25 min    JESSICA U VANN, DO [facility] Pager [phone]  If 7PM-7AM, please contact night-coverage [email or web address] Password [email or web address] 11/14/2016, 1:17 PM   

## 2016-11-14 NOTE — Op Note (Signed)
[facility] Patient Name: Jorge Baker Procedure Date : 11/14/2016 MRN: [id number] Attending MD: Rachael Fee , MD Date of Birth: Nov 22, 1965 CSN: [id number] Age: 52 Admit Type: Inpatient Procedure:                Upper GI endoscopy Indications:              Acute post hemorrhagic anemia, Melena Providers:                Rachael Fee, MD, Will Bonnet RN, RN,                            Arlee Muslim Tech., Technician, Romie Minus,                            CRNA Referring MD:              Medicines:                Monitored Anesthesia Care Complications:            No immediate complications. Estimated blood loss:                            None. Estimated Blood Loss:     Estimated blood loss: none. Procedure:                Pre-Anesthesia Assessment:                           - Prior to the procedure, a History and Physical                            was performed, and patient medications and                            allergies were reviewed. The patient's tolerance of                            previous anesthesia was also reviewed. The risks                            and benefits of the procedure and the sedation                            options and risks were discussed with the patient.                            All questions were answered, and informed consent                            was obtained. Prior Anticoagulants: The patient has                            taken no previous anticoagulant or antiplatelet                            agents. ASA  Grade Assessment: IV - A patient with                            severe systemic disease that is a constant threat                            to life. After reviewing the risks and benefits,                            the patient was deemed in satisfactory condition to                            undergo the procedure.                           After obtaining informed consent, the endoscope was                             passed under direct vision. Throughout the                            procedure, the patient's blood pressure, pulse, and                            oxygen saturations were monitored continuously. The                            [id number] ([id number]) scope was introduced through the                            mouth, and advanced to the second part of duodenum.                            The upper GI endoscopy was accomplished without                            difficulty. The patient tolerated the procedure                            well. Scope In: Scope Out: Findings:      The esophagus was normal.      Mild inflammation characterized by erythema and friability was found in       the entire examined stomach. Biopsies were taken with a cold forceps for       histology.      The examined duodenum was normal. Impression:               - Normal esophagus.                           - Gastritis, biopsied to check for H. pylori.                           - Normal examined duodenum.                           -  NO signs of portal hypertension. Moderate Sedation:      none Recommendation:           - Return patient to hospital ward for ongoing care.                           - Clear liquid diet.                           - Will stop octreotide and change protonix to once                            daily oral dosing for now.                           - If biopsies are + for H. pylori will start on                            appropriate antibiotics.                           - Colonoscopy tomorrow to continue the workup of                            dark stools, anemia. Procedure Code(s):        --- Professional ---                           [phone], Esophagogastroduodenoscopy, flexible,                            transoral; with biopsy, single or multiple Diagnosis Code(s):        --- Professional ---                           K29.70, Gastritis, unspecified, without  bleeding                           D62, Acute posthemorrhagic anemia                           K92.1, Melena (includes Hematochezia) CPT copyright 2016 American Medical Association. All rights reserved. The codes documented in this report are preliminary and upon coder review may  be revised to meet current compliance requirements. Rachael Fee, MD 11/14/2016 1:03:03 PM This report has been signed electronically. Number of Addenda: 0

## 2016-11-15 ENCOUNTER — Encounter (HOSPITAL_COMMUNITY)
Admission: EM | Disposition: A | Discharge status: Skilled Nursing Facility | Payer: Self-pay | Source: Home / Self Care | Attending: Internal Medicine

## 2016-11-15 ENCOUNTER — Inpatient Hospital Stay (HOSPITAL_COMMUNITY): Payer: Medicare HMO | Admitting: Certified Registered"

## 2016-11-15 ENCOUNTER — Encounter (HOSPITAL_COMMUNITY): Payer: Self-pay | Admitting: Certified Registered"

## 2016-11-15 DIAGNOSIS — K649 Unspecified hemorrhoids: Secondary | ICD-10-CM | POA: Diagnosis present

## 2016-11-15 HISTORY — PX: COLONOSCOPY WITH PROPOFOL: SHX5780

## 2016-11-15 LAB — CBC
HCT: 20.7 % — ABNORMAL LOW (ref 39.0–52.0)
Hemoglobin: 7.1 g/dL — ABNORMAL LOW (ref 13.0–17.0)
MCH: 30.5 pg (ref 26.0–34.0)
MCHC: 34.3 g/dL (ref 30.0–36.0)
MCV: 88.8 fL (ref 78.0–100.0)
Platelets: 94 10*3/uL — ABNORMAL LOW (ref 150–400)
RBC: 2.33 MIL/uL — ABNORMAL LOW (ref 4.22–5.81)
RDW: 16.6 % — AB (ref 11.5–15.5)
WBC: 5.7 10*3/uL (ref 4.0–10.5)

## 2016-11-15 LAB — BASIC METABOLIC PANEL
Anion gap: 12 (ref 5–15)
CALCIUM: 7 mg/dL — AB (ref 8.9–10.3)
CO2: 21 mmol/L — ABNORMAL LOW (ref 22–32)
Chloride: 97 mmol/L — ABNORMAL LOW (ref 101–111)
Creatinine, Ser: 0.65 mg/dL (ref 0.61–1.24)
GFR calc Af Amer: >60 mL/min (ref >60)
GLUCOSE: 264 mg/dL — AB (ref 65–99)
Potassium: 3.5 mmol/L (ref 3.5–5.1)
Sodium: 130 mmol/L — ABNORMAL LOW (ref 135–145)

## 2016-11-15 LAB — GLUCOSE, CAPILLARY
Glucose-Capillary: 271 mg/dL — ABNORMAL HIGH (ref 65–99)
Glucose-Capillary: 292 mg/dL — ABNORMAL HIGH (ref 65–99)
Glucose-Capillary: 257 mg/dL — ABNORMAL HIGH (ref 65–99)
Glucose-Capillary: 246 mg/dL — ABNORMAL HIGH (ref 65–99)
Glucose-Capillary: 134 mg/dL — ABNORMAL HIGH (ref 65–99)
Glucose-Capillary: 173 mg/dL — ABNORMAL HIGH (ref 65–99)

## 2016-11-15 LAB — MAGNESIUM: Magnesium: 1.2 mg/dL — ABNORMAL LOW (ref 1.7–2.4)

## 2016-11-15 LAB — PHOSPHORUS: Phosphorus: 1.1 mg/dL — ABNORMAL LOW (ref 2.5–4.6)

## 2016-11-15 SURGERY — COLONOSCOPY WITH PROPOFOL
Anesthesia: Monitor Anesthesia Care

## 2016-11-15 MED ORDER — LORAZEPAM 1 MG PO TABS: 1.0000 mg | ORAL_TABLET | Freq: Four times a day (QID) | ORAL | Status: AC | PRN

## 2016-11-15 MED ORDER — DEXTROSE-NACL 5-0.9 % IV SOLN
INTRAVENOUS | Status: DC
  Administered 2016-11-15 – 2016-11-16 (×2): via INTRAVENOUS

## 2016-11-15 MED ORDER — DEXTROSE-NACL 5-0.45 % IV SOLN: INTRAVENOUS | Status: DC

## 2016-11-15 MED ORDER — LORAZEPAM 2 MG/ML IJ SOLN: 1.0000 mg | Freq: Four times a day (QID) | INTRAMUSCULAR | Status: AC | PRN

## 2016-11-15 MED ORDER — LACTATED RINGERS IV SOLN
INTRAVENOUS | Status: DC
  Administered 2016-11-15: 10:00:00 via INTRAVENOUS

## 2016-11-15 MED ORDER — INSULIN GLARGINE 100 UNIT/ML ~~LOC~~ SOLN
8.0000 [IU] | Freq: Every day | SUBCUTANEOUS | Status: DC
  Administered 2016-11-15 – 2016-11-17 (×3): 8 [IU] via SUBCUTANEOUS
  Filled 2016-11-15 (×3): qty 0.08

## 2016-11-15 MED ORDER — MAGNESIUM SULFATE 2 GM/50ML IV SOLN
2.0000 g | Freq: Once | INTRAVENOUS | Status: AC
  Administered 2016-11-15: 2 g via INTRAVENOUS
  Filled 2016-11-15: qty 50

## 2016-11-15 MED ORDER — INSULIN ASPART 100 UNIT/ML ~~LOC~~ SOLN
0.0000 [IU] | Freq: Three times a day (TID) | SUBCUTANEOUS | Status: DC
  Administered 2016-11-15: 1 [IU] via SUBCUTANEOUS
  Administered 2016-11-16 (×2): 2 [IU] via SUBCUTANEOUS
  Administered 2016-11-17: 3 [IU] via SUBCUTANEOUS
  Administered 2016-11-18: 1 [IU] via SUBCUTANEOUS
  Administered 2016-11-19 – 2016-11-20 (×4): 7 [IU] via SUBCUTANEOUS
  Administered 2016-11-20: 2 [IU] via SUBCUTANEOUS
  Administered 2016-11-20: 9 [IU] via SUBCUTANEOUS
  Administered 2016-11-21: 3 [IU] via SUBCUTANEOUS
  Administered 2016-11-21: 2 [IU] via SUBCUTANEOUS
  Administered 2016-11-21: 5 [IU] via SUBCUTANEOUS
  Administered 2016-11-22: 2 [IU] via SUBCUTANEOUS
  Administered 2016-11-22: 1 [IU] via SUBCUTANEOUS
  Administered 2016-11-22: 5 [IU] via SUBCUTANEOUS

## 2016-11-15 MED ORDER — PROPOFOL 10 MG/ML IV BOLUS
INTRAVENOUS | Status: DC | PRN
Start: 2016-11-15 — End: 2016-11-15
  Administered 2016-11-15: 20 mg via INTRAVENOUS
  Administered 2016-11-15 (×2): 10 mg via INTRAVENOUS

## 2016-11-15 MED ORDER — SODIUM CHLORIDE 0.9 % IV SOLN: INTRAVENOUS | Status: DC

## 2016-11-15 MED ORDER — LIDOCAINE 2% (20 MG/ML) 5 ML SYRINGE
INTRAMUSCULAR | Status: DC | PRN
  Administered 2016-11-15: 40 mg via INTRAVENOUS

## 2016-11-15 MED ORDER — POTASSIUM & SODIUM PHOSPHATES 280-160-250 MG PO PACK
1.0000 | PACK | Freq: Three times a day (TID) | ORAL | Status: DC
  Administered 2016-11-15 – 2016-11-21 (×21): 1 via ORAL
  Filled 2016-11-15 (×29): qty 1

## 2016-11-15 MED ORDER — PROPOFOL 500 MG/50ML IV EMUL
INTRAVENOUS | Status: DC | PRN
Start: 2016-11-15 — End: 2016-11-15
  Administered 2016-11-15: 75 ug/kg/min via INTRAVENOUS

## 2016-11-15 MED ORDER — INSULIN ASPART 100 UNIT/ML ~~LOC~~ SOLN
0.0000 [IU] | Freq: Every day | SUBCUTANEOUS | Status: DC
  Administered 2016-11-19: 5 [IU] via SUBCUTANEOUS
  Administered 2016-11-20: 2 [IU] via SUBCUTANEOUS
  Administered 2016-11-21: 5 [IU] via SUBCUTANEOUS

## 2016-11-15 MED ORDER — INSULIN ASPART 100 UNIT/ML ~~LOC~~ SOLN
0.0000 [IU] | SUBCUTANEOUS | Status: DC
  Administered 2016-11-15: 3 [IU] via SUBCUTANEOUS

## 2016-11-15 NOTE — Anesthesia Preprocedure Evaluation (Signed)
Anesthesia Evaluation  Patient identified by MRN, date of birth, ID band Patient confused    Reviewed: Allergy & Precautions, Patient's Chart, lab work & pertinent test results  History of Anesthesia Complications Negative for: history of anesthetic complications  Airway Mallampati: II  TM Distance: >3 FB Neck ROM: Full    Dental  (+) Edentulous Upper, Edentulous Lower   Pulmonary Current Smoker,    Pulmonary exam normal breath sounds clear to auscultation       Cardiovascular negative cardio ROS Normal cardiovascular exam Rhythm:Regular Rate:Normal     Neuro/Psych negative neurological ROS  negative psych ROS   GI/Hepatic negative GI ROS, (+)     substance abuse  alcohol use,   Endo/Other  diabetes, Poorly Controlled  Renal/GU      Musculoskeletal   Abdominal   Peds  Hematology   Anesthesia Other Findings   Reproductive/Obstetrics                             Anesthesia Physical Anesthesia Plan  ASA: IV  Anesthesia Plan: MAC   Post-op Pain Management:    Induction: Intravenous  Airway Management Planned: Natural Airway  Additional Equipment:   Intra-op Plan:   Post-operative Plan:   Informed Consent: I have reviewed the patients History and Physical, chart, labs and discussed the procedure including the risks, benefits and alternatives for the proposed anesthesia with the patient or authorized representative who has indicated his/her understanding and acceptance.   Dental advisory given  Plan Discussed with:   Anesthesia Plan Comments:         Anesthesia Quick Evaluation

## 2016-11-15 NOTE — Progress Notes (Addendum)
PROGRESS NOTE    Jorge ChimesJoseph Worthington  WUJ:[id number] DOB: 04/13/1966 DOA: 11/12/2016 PCP: Lavell IslamLOWARD,DAVIS L, MD   Outpatient Specialists:     Brief Narrative:  Jorge Baker is a 51 y.o. male with medical history significant of DM with resultant visual impairment, osteoporosis and ETOH abuse presenting because he feels like "something is sucking the life out of me."    Has been feeling bad for the last 4-5 days.  Noticed lack of energy first.  Then total loss of energy.  Unable to stand because he is so weak.  No SOB.  Whole body hurts, mostly the last 3 days.  Chronic cough, no change fgrom baseline.  No fevers.  No abdominal pain.  Doesn't look at stools, has not noticed color change.  Has had loose stools for over a year.  +n/v for a week or more, denies blood in it.  For pain, takes oxycodone if it is really bad; if not too bad he just drinks more alcohol.  Drinks 1/4-1/2 cup of alcohol all day every day.  Prefers bourbon whiskey.   In SDU for hypoglycemia.  Has had both EGD and colonoscopy.  Now in withdrawal so will remain in SDU.   Assessment & Plan:   Principal Problem:   Symptomatic anemia Active Problems:   Upper GI bleeding   Diabetes mellitus, type 2 (HCC)   Alcohol abuse   Right knee pain   Gastritis and gastroduodenitis   Hemorrhoids   Symptomatic anemia -EGD with gastritis -colonoscopy shows: External and internal hemorrhoids. -change protonix to PO  ETOH dependence with withdrawal and alcoholic hepatitis -Patient with chronic ETOH dependence -Has failed rehab in the past -Currently no idea when his last day of non-drinking was -He is at high risk for complications of withdrawal including seizures, DTs -CIWA protocol- may need scheduled ativan  -Thiamine and folate ordered -Folate deficient, will need on an ongoing basis  DM- type 1 -decreased Lantus while NPO -Last A1c was 5.1 in 2/17 now 6.4 -change from D10 to D5  Knee pain -He reports [facility] onset of  [facility] -x ray normal  Increased ammonia -xifamin added As well as lactulose   DVT prophylaxis:  SCD's  Code Status: Full Code   Family Communication: patient  Disposition Plan:     Consultants:   GI     Subjective: Less tremulous but still plesantly confused  Objective: Vitals:   11/15/16 0957 11/15/16 1104 11/15/16 1105 11/15/16 1125  BP: (!) 152/81 137/90 137/90 (!) 157/106  Pulse: (!) 104 (!) 106 (!) 104 (!) 119  Resp: (!) 22 (!) 27 (!) 27 (!) 26  Temp:    98.2 F (36.8 C)  TempSrc:  Oral    SpO2: 98% 100% 100% 99%  Weight:      Height:        Intake/Output Summary (Last 24 hours) at 11/15/16 1323 Last data filed at 11/15/16 1058  Gross per 24 hour  Intake          1239.17 ml  Output              225 ml  Net          1014.17 ml   Filed Weights   11/12/16 1210 11/12/16 1808 11/15/16 0330  Weight: 53.5 kg (118 lb) 54.6 kg (120 lb 5.9 oz) 61.4 kg (135 lb 6.4 oz)    Examination:  General exam: Appears calm and comfortable  Respiratory system: Clear to auscultation. Respiratory effort  normal. Cardiovascular system: S1 & S2 heard, RRR. No JVD, murmurs, rubs, gallops or clicks. No pedal edema. Gastrointestinal system: Abdomen is nondistended, soft and nontender. No organomegaly or masses felt. Normal bowel sounds heard. Central nervous system: pleasantly confused     Data Reviewed: I have personally reviewed following labs and imaging studies  CBC:  Recent Labs Lab 11/12/16 1235 11/13/16 1250 11/14/16 0242 11/15/16 0227  WBC 7.4 6.8 6.5 5.7  NEUTROABS 6.1  --   --   --   HGB 7.0* 7.3*  7.4* 7.2* 7.1*  HCT 20.2* 21.5*  21.4* 20.6* 20.7*  MCV 90.6 90.0 89.6 88.8  PLT 73* 60* 72* 94*   Basic Metabolic Panel:  Recent Labs Lab 11/12/16 1235 11/14/16 0242 11/15/16 0227  NA 133* 134* 130*  K 4.3 3.8 3.5  CL 99* 101 97*  CO2 19* 22 21*  GLUCOSE 91 81 264*  BUN 20 10 <5*  CREATININE 0.53* 0.60* 0.65  CALCIUM  7.9* 7.2* 7.0*  MG  --   --  1.2*  PHOS  --   --  1.1*   GFR: Estimated Creatinine Clearance: 93.5 mL/min (by C-G formula based on SCr of 0.65 mg/dL). Liver Function Tests:  Recent Labs Lab 11/12/16 1235 11/14/16 0242  AST 181* 209*  ALT 68* 61  ALKPHOS 306* 233*  BILITOT 1.0 1.0  PROT 5.6* 4.8*  ALBUMIN 2.2* 1.8*    Recent Labs Lab 11/12/16 1235  LIPASE 26    Recent Labs Lab 11/13/16 1250  AMMONIA 60*   Coagulation Profile:  Recent Labs Lab 11/12/16 2007 11/13/16 1250  INR 1.12 1.14   Cardiac Enzymes: No results for input(s): CKTOTAL, CKMB, CKMBINDEX, TROPONINI in the last 168 hours. BNP (last 3 results) No results for input(s): PROBNP in the last 8760 hours. HbA1C:  Recent Labs  11/12/16 2148  HGBA1C 6.4*   CBG:  Recent Labs Lab 11/14/16 2245 11/15/16 0105 11/15/16 0449 11/15/16 0809 11/15/16 1145  GLUCAP 277* 271* 292* 257* 246*   Lipid Profile: No results for input(s): CHOL, HDL, LDLCALC, TRIG, CHOLHDL, LDLDIRECT in the last 72 hours. Thyroid Function Tests: No results for input(s): TSH, T4TOTAL, FREET4, T3FREE, THYROIDAB in the last 72 hours. Anemia Panel:  Recent Labs  11/12/16 1455  VITAMINB12 696  FOLATE 1.7*  FERRITIN 1,611*  TIBC 168*  IRON 145   Urine analysis:    Component Value Date/Time   COLORURINE AMBER (A) 11/12/2016 1455   APPEARANCEUR CLEAR 11/12/2016 1455   LABSPEC 1.015 11/12/2016 1455   PHURINE 6.0 11/12/2016 1455   GLUCOSEU NEGATIVE 11/12/2016 1455   HGBUR TRACE (A) 11/12/2016 1455   BILIRUBINUR NEGATIVE 11/12/2016 1455   KETONESUR 15 (A) 11/12/2016 1455   PROTEINUR NEGATIVE 11/12/2016 1455   NITRITE NEGATIVE 11/12/2016 1455   LEUKOCYTESUR NEGATIVE 11/12/2016 1455     ) Recent Results (from the past 240 hour(s))  Urine culture     Status: Abnormal   Collection Time: 11/12/16  2:55 PM  Result Value Ref Range Status   Specimen Description URINE, CLEAN CATCH  Final   Special Requests NONE  Final    Culture (A)  Final    <10,000 COLONIES/mL INSIGNIFICANT GROWTH Performed at [facility], [address] [place], [place] [address]    Report Status 11/14/2016 FINAL  Final  MRSA PCR Screening     Status: None   Collection Time: 11/14/16 12:24 AM  Result Value Ref Range Status   MRSA by PCR NEGATIVE NEGATIVE Final  Comment:        The GeneXpert MRSA Assay (FDA approved for NASAL specimens only), is one component of a comprehensive MRSA colonization surveillance program. It is not intended to diagnose MRSA infection nor to guide or monitor treatment for MRSA infections.       Anti-infectives    Start     Dose/Rate Route Frequency Ordered Stop   11/14/16 1100  rifaximin (XIFAXAN) tablet 550 mg  Status:  Discontinued     550 mg Oral 2 times daily 11/14/16 0943 11/14/16 1313   11/12/16 2300  cefTRIAXone (ROCEPHIN) 2 g in dextrose 5 % 50 mL IVPB  Status:  Discontinued     2 g 100 mL/hr over 30 Minutes Intravenous Every 24 hours 11/12/16 2254 11/14/16 1312       Radiology Studies: No results found.      Scheduled Meds: . folic acid  1 mg Oral Daily  . insulin aspart  0-9 Units Subcutaneous Q4H  . insulin glargine  5 Units Subcutaneous QHS  . lactulose  20 g Oral BID  . multivitamin with minerals  1 tablet Oral Daily  . pantoprazole  40 mg Oral Q0600  . sodium chloride flush  3 mL Intravenous Q12H  . thiamine  100 mg Oral Daily   Or  . thiamine  100 mg Intravenous Daily   Continuous Infusions: . dextrose 5 % and 0.9% NaCl 75 mL/hr at 11/15/16 1216     LOS: 2 days    Time spent: 25 min    Esther Broyles U Ameenah Prosser, DO [facility] Pager [phone]  If 7PM-7AM, please contact night-coverage [email or web address] Password [email or web address] 11/15/2016, 1:23 PM

## 2016-11-15 NOTE — Op Note (Signed)
[facility] Patient Name: Jorge Baker Procedure Date : 11/15/2016 MRN: [id number] Attending MD: Rachael Fee , MD Date of Birth: 09/08/1966 CSN: [id number] Age: 51 Admit Type: Inpatient Procedure:                Colonoscopy Indications:              Heme positive stool, Iron deficiency anemia Providers:                Rachael Fee, MD, Dwain Sarna, RN, Beryle Beams, Technician, Merril Abbe, CRNA Referring MD:              Medicines:                Monitored Anesthesia Care Complications:            No immediate complications. Estimated blood loss:                            None. Estimated Blood Loss:     Estimated blood loss: none. Procedure:                Pre-Anesthesia Assessment:                           - Prior to the procedure, a History and Physical                            was performed, and patient medications and                            allergies were reviewed. The patient's tolerance of                            previous anesthesia was also reviewed. The risks                            and benefits of the procedure and the sedation                            options and risks were discussed with the patient.                            All questions were answered, and informed consent                            was obtained. Prior Anticoagulants: The patient has                            taken no previous anticoagulant or antiplatelet                            agents. ASA Grade Assessment: IV - A patient with  severe systemic disease that is a constant threat                            to life. After reviewing the risks and benefits,                            the patient was deemed in satisfactory condition to                            undergo the procedure.                           After obtaining informed consent, the colonoscope                            was passed under  direct vision. Throughout the                            procedure, the patient's blood pressure, pulse, and                            oxygen saturations were monitored continuously. The                            EC-[id number] ([id number] scope was introduced through                            the anus and advanced to the the cecum, identified                            by appendiceal orifice and ileocecal valve. The                            colonoscopy was performed without difficulty. The                            patient tolerated the procedure well. The quality                            of the bowel preparation was good. The ileocecal                            valve, appendiceal orifice, and rectum were                            photographed. Scope In: 10:48:29 AM Scope Out: 10:57:28 AM Scope Withdrawal Time: 0 hours 6 minutes 3 seconds  Total Procedure Duration: 0 hours 8 minutes 59 seconds  Findings:      External and internal hemorrhoids were found. The hemorrhoids were       medium-sized.      The exam was otherwise without abnormality on direct and retroflexion       views. Impression:               - External and internal hemorrhoids.                           -  The examination was otherwise normal on direct                            and retroflexion views.                           - No polyps or cancers Moderate Sedation:      none Recommendation:           - Return patient to hospital ward for ongoing care.                           - Resume previous diet.                           - Continue present medications.                           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                           - No clear source of his anemia on EGD or this                            colonoscopy. Since no overt GI bleeding I think he                             is safe to go home, follow up with a PCP. Procedure Code(s):        --- Professional ---                           [phone], Colonoscopy, flexible; diagnostic, including                            collection of specimen(s) by brushing or washing,                            when performed (separate procedure) Diagnosis Code(s):        --- Professional ---                           K64.8, Other hemorrhoids                           R19.5, Other fecal abnormalities                           D50.9, Iron deficiency anemia, unspecified CPT copyright 2016 American Medical Association. All rights reserved. The codes documented in this report are preliminary and upon coder review may  be  revised to meet current compliance requirements. Rachael Fee, MD 11/15/2016 11:04:03 AM This report has been signed electronically. Number of Addenda: 0

## 2016-11-15 NOTE — Interval H&P Note (Signed)
History and Physical Interval Note:  11/15/2016 10:25 AM  Jorge ChimesJoseph Baker  has presented today for surgery, with the diagnosis of anemia, FOBT + stools  The various methods of treatment have been discussed with the patient and family. After consideration of risks, benefits and other options for treatment, the patient has consented to  Procedure(s): COLONOSCOPY WITH PROPOFOL (N/A) as a surgical intervention .  The patient's history has been reviewed, patient examined, no change in status, stable for surgery.  I have reviewed the patient's chart and labs.  Questions were answered to the patient's satisfaction.     Rachael FeeJacobs, Tyquarius Paglia P

## 2016-11-15 NOTE — H&P (View-Only) (Signed)
PROGRESS NOTE    Jorge Baker  MRN:[id number] DOB: 08/28/1966 DOA: 11/12/2016 PCP: CLOWARD,DAVIS L, MD   Outpatient Specialists:     Brief Narrative:  Jorge Baker is a 50 y.o. male with medical history significant of DM with resultant visual impairment, osteoporosis and ETOH abuse presenting because he feels like "something is sucking the life out of me."    Has been feeling bad for the last 4-5 days.  Noticed lack of energy first.  Then total loss of energy.  Unable to stand because he is so weak.  No SOB.  Whole body hurts, mostly the last 3 days.  Chronic cough, no change fgrom baseline.  No fevers.  No abdominal pain.  Doesn't look at stools, has not noticed color change.  Has had loose stools for over a year.  +n/v for a week or more, denies blood in it.  For pain, takes oxycodone if it is really bad; if not too bad he just drinks more alcohol.  Drinks 1/4-1/2 cup of alcohol all day every day.  Prefers bourbon whiskey.     Assessment & Plan:   Principal Problem:   Symptomatic anemia Active Problems:   Upper GI bleeding   Diabetes mellitus, type 2 (HCC)   Alcohol abuse   Right knee pain   Gastritis and gastroduodenitis   Symptomatic anemia -EGD with gastritis -Heme positive stools- for colonoscopy tomm -change protonix to PO -Will start Rocephin empirically for prophylaxis - Zofran IV for nausea - Maintain IV access (2 large bore IVs if possible).  ETOH dependence with withdrawal -Patient with chronic ETOH dependence -Has failed rehab in the past -Currently no idea when his last day of non-drinking was -He is at high risk for complications of withdrawal including seizures, DTs -CIWA protocol- may need scheduled ativan -Thiamine and folate ordered -Folate deficient, will need on an ongoing basis  DM- type 1 -decreased Lantus while NPO -Last A1c was 5.1 in 2/17 now 6.4 -D10 and SSI  Knee pain -He reports acute onset of knee pain while at [facility] -x ray  normal  Increased ammonia -xifamin added   DVT prophylaxis:  SCD's  Code Status: Full Code   Family Communication: Patient/mother on phone  Disposition Plan:     Consultants:   GI     Subjective: Confused- sideways in bed  Objective: Vitals:   11/14/16 0900 11/14/16 1133 11/14/16 1303 11/14/16 1315  BP: 95/80 115/87 138/79 (!) 149/71  Pulse: (!) 105 88 80 86  Resp: (!) 23 20 (!) 22 17  Temp:      TempSrc:   Oral   SpO2: 100% 100% 99% 100%  Weight:      Height:        Intake/Output Summary (Last 24 hours) at 11/14/16 1317 Last data filed at 11/14/16 1237  Gross per 24 hour  Intake          2837.92 ml  Output              325 ml  Net          2512.92 ml   Filed Weights   11/12/16 1210 11/12/16 1808  Weight: 53.5 kg (118 lb) 54.6 kg (120 lb 5.9 oz)    Examination:  General exam: Appears calm and comfortable  Respiratory system: Clear to auscultation. Respiratory effort normal. Cardiovascular system: S1 & S2 heard, RRR. No JVD, murmurs, rubs, gallops or clicks. No pedal edema. Gastrointestinal system: Abdomen is nondistended, soft and nontender. No organomegaly or   masses felt. Normal bowel sounds heard. Central nervous system: pleasantly confused     Data Reviewed: I have personally reviewed following labs and imaging studies  CBC:  Recent Labs Lab 11/12/16 1235 11/13/16 1250 11/14/16 0242  WBC 7.4 6.8 6.5  NEUTROABS 6.1  --   --   HGB 7.0* 7.3*  7.4* 7.2*  HCT 20.2* 21.5*  21.4* 20.6*  MCV 90.6 90.0 89.6  PLT 73* 60* 72*   Basic Metabolic Panel:  Recent Labs Lab 11/12/16 1235 11/14/16 0242  NA 133* 134*  K 4.3 3.8  CL 99* 101  CO2 19* 22  GLUCOSE 91 81  BUN 20 10  CREATININE 0.53* 0.60*  CALCIUM 7.9* 7.2*   GFR: Estimated Creatinine Clearance: 86.3 mL/min (by C-G formula based on SCr of 0.6 mg/dL (L)). Liver Function Tests:  Recent Labs Lab 11/12/16 1235 11/14/16 0242  AST 181* 209*  ALT 68* 61  ALKPHOS 306*  233*  BILITOT 1.0 1.0  PROT 5.6* 4.8*  ALBUMIN 2.2* 1.8*    Recent Labs Lab 11/12/16 1235  LIPASE 26    Recent Labs Lab 11/13/16 1250  AMMONIA 60*   Coagulation Profile:  Recent Labs Lab 11/12/16 2007 11/13/16 1250  INR 1.12 1.14   Cardiac Enzymes: No results for input(s): CKTOTAL, CKMB, CKMBINDEX, TROPONINI in the last 168 hours. BNP (last 3 results) No results for input(s): PROBNP in the last 8760 hours. HbA1C:  Recent Labs  11/12/16 2148  HGBA1C 6.4*   CBG:  Recent Labs Lab 11/14/16 0752 11/14/16 1051 11/14/16 1110 11/14/16 1210 11/14/16 1311  GLUCAP 102* 60* 136* 94 96   Lipid Profile: No results for input(s): CHOL, HDL, LDLCALC, TRIG, CHOLHDL, LDLDIRECT in the last 72 hours. Thyroid Function Tests: No results for input(s): TSH, T4TOTAL, FREET4, T3FREE, THYROIDAB in the last 72 hours. Anemia Panel:  Recent Labs  11/12/16 1235 11/12/16 1455  VITAMINB12  --  696  FOLATE  --  1.7*  FERRITIN  --  1,611*  TIBC  --  168*  IRON  --  145  RETICCTPCT 1.9  --    Urine analysis:    Component Value Date/Time   COLORURINE AMBER (A) 11/12/2016 1455   APPEARANCEUR CLEAR 11/12/2016 1455   LABSPEC 1.015 11/12/2016 1455   PHURINE 6.0 11/12/2016 1455   GLUCOSEU NEGATIVE 11/12/2016 1455   HGBUR TRACE (A) 11/12/2016 1455   BILIRUBINUR NEGATIVE 11/12/2016 1455   KETONESUR 15 (A) 11/12/2016 1455   PROTEINUR NEGATIVE 11/12/2016 1455   NITRITE NEGATIVE 11/12/2016 1455   LEUKOCYTESUR NEGATIVE 11/12/2016 1455     ) Recent Results (from the past 240 hour(s))  Urine culture     Status: Abnormal   Collection Time: 11/12/16  2:55 PM  Result Value Ref Range Status   Specimen Description URINE, CLEAN CATCH  Final   Special Requests NONE  Final   Culture (A)  Final    <10,000 COLONIES/mL INSIGNIFICANT GROWTH Performed at [facility], [address], [place], [place] [address]    Report Status 11/14/2016 FINAL  Final  MRSA PCR Screening     Status:  None   Collection Time: 11/14/16 12:24 AM  Result Value Ref Range Status   MRSA by PCR NEGATIVE NEGATIVE Final    Comment:        The GeneXpert MRSA Assay (FDA approved for NASAL specimens only), is one component of a comprehensive MRSA colonization surveillance program. It is not intended to diagnose MRSA infection nor to guide or  monitor treatment for MRSA infections.       Anti-infectives    Start     Dose/Rate Route Frequency Ordered Stop   11/14/16 1100  rifaximin (XIFAXAN) tablet 550 mg  Status:  Discontinued     550 mg Oral 2 times daily 11/14/16 0943 11/14/16 1313   11/12/16 2300  cefTRIAXone (ROCEPHIN) 2 g in dextrose 5 % 50 mL IVPB  Status:  Discontinued     2 g 100 mL/hr over 30 Minutes Intravenous Every 24 hours 11/12/16 2254 11/14/16 1312       Radiology Studies: Dg Chest 2 View  Result Date: 11/12/2016 CLINICAL DATA:  Tachycardia, weakness. EXAM: CHEST  2 VIEW COMPARISON:  Radiographs of June 29, 2016. FINDINGS: The heart size and mediastinal contours are within normal limits. Both lungs are clear. No pneumothorax or pleural effusion is noted. The visualized skeletal structures are unremarkable. IMPRESSION: No active cardiopulmonary disease. Electronically Signed   By: James  Green Jr, M.D.   On: 11/12/2016 13:50   Dg Knee 2 Views Right  Result Date: 11/13/2016 CLINICAL DATA:  Acute knee pain.  No known injury. EXAM: RIGHT KNEE - 3 VIEW COMPARISON:  None. FINDINGS: Chondrocalcinosis. No acute bony abnormality. Specifically, no fracture, subluxation, or dislocation. Soft tissues are intact. No joint effusion IMPRESSION: No acute bony abnormality. Electronically Signed   By: Kevin  Dover M.D.   On: 11/13/2016 08:16        Scheduled Meds: . bisacodyl  10 mg Oral Once  . [MAR Hold] folic acid  1 mg Oral Daily  . [MAR Hold] insulin glargine  5 Units Subcutaneous QHS  . metoCLOPramide (REGLAN) injection  10 mg Intravenous Once   Followed by  . [START ON  11/15/2016] metoCLOPramide (REGLAN) injection  10 mg Intravenous Once  . [MAR Hold] multivitamin with minerals  1 tablet Oral Daily  . [START ON 11/15/2016] pantoprazole  40 mg Oral Q0600  . peg 3350 powder  1 kit Oral Once  . [MAR Hold] propranolol  10 mg Oral TID  . [MAR Hold] sodium chloride flush  3 mL Intravenous Q12H  . [MAR Hold] thiamine  100 mg Oral Daily   Or  . [MAR Hold] thiamine  100 mg Intravenous Daily   Continuous Infusions: . dextrose 50 mL/hr at 11/14/16 1100     LOS: 1 day    Time spent: 25 min    JESSICA U VANN, DO [facility] Pager [phone]  If 7PM-7AM, please contact night-coverage [email or web address] Password [email or web address] 11/14/2016, 1:17 PM   

## 2016-11-15 NOTE — Transfer of Care (Signed)
Immediate Anesthesia Transfer of Care Note  Patient: Jorge Baker  Procedure(s) Performed: Procedure(s): COLONOSCOPY WITH PROPOFOL (N/A)  Patient Location: Endoscopy Unit  Anesthesia Type:MAC  Level of Consciousness: sedated and patient cooperative  Airway & Oxygen Therapy: Patient Spontanous Breathing and Patient connected to face mask oxygen  Post-op Assessment: Report given to RN and Post -op Vital signs reviewed and stable  Post vital signs: Reviewed and stable  Last Vitals:  Vitals:   11/15/16 0957 11/15/16 1104  BP: (!) 152/81 137/90  Pulse: (!) 104 (!) 106  Resp: (!) 22 (!) 27  Temp:      Last Pain:  Vitals:   11/15/16 1104  TempSrc: Oral  PainSc:       Patients Stated Pain Goal: 0 (46/28/63 8177)  Complications: No apparent anesthesia complications

## 2016-11-15 NOTE — Anesthesia Postprocedure Evaluation (Addendum)
Anesthesia Post Note  Patient: Jorge Baker  Procedure(s) Performed: Procedure(s) (LRB): COLONOSCOPY WITH PROPOFOL (N/A)  Patient location during evaluation: Endoscopy Anesthesia Type: MAC Level of consciousness: awake and alert Pain management: pain level controlled Vital Signs Assessment: post-procedure vital signs reviewed and stable Respiratory status: spontaneous breathing, nonlabored ventilation, respiratory function stable and patient connected to nasal cannula oxygen Cardiovascular status: stable and blood pressure returned to baseline Anesthetic complications: no       Last Vitals:  Vitals:   11/15/16 1105 11/15/16 1125  BP: 137/90 (!) 157/106  Pulse: (!) 104 (!) 119  Resp: (!) 27 (!) 26  Temp:  36.8 C    Last Pain:  Vitals:   11/15/16 1104  TempSrc: Oral  PainSc:                  Jaylyne Breese,JAMES TERRILL

## 2016-11-15 NOTE — Progress Notes (Signed)
Inpatient Diabetes Program Recommendations  AACE/ADA: New Consensus Statement on Inpatient Glycemic Control (2015)  Target Ranges:  Prepandial:   less than 140 mg/dL      Peak postprandial:   less than 180 mg/dL (1-2 hours)      Critically ill patients:  140 - 180 mg/dL   Lab Results  Component Value Date   GLUCAP 246 (H) 11/15/2016   HGBA1C 6.4 (H) 11/12/2016    Review of Glycemic Control Results for Nelson ChimesMARSHALL, Mateus (MRN [id number]) as of 11/15/2016 12:02  Ref. Range 11/14/2016 22:45 11/15/2016 01:05 11/15/2016 04:49 11/15/2016 08:09 11/15/2016 11:45  Glucose-Capillary Latest Ref Range: 65 - 99 mg/dL 409277 (H) 811271 (H) 914292 (H) 257 (H) 246 (H)   Inpatient Diabetes Program Recommendations:  Noted change in Novolog correction. Please consider increase in Lantus to 8-10 units daily.  Thank you, Billy FischerJudy E. Marquesha Robideau, RN, MSN, CDE Inpatient Glycemic Control Team Team Pager [phone] (8am-5pm) 11/15/2016 12:03 PM

## 2016-11-16 DIAGNOSIS — D696 Thrombocytopenia, unspecified: Secondary | ICD-10-CM

## 2016-11-16 DIAGNOSIS — R531 Weakness: Secondary | ICD-10-CM

## 2016-11-16 DIAGNOSIS — M25561 Pain in right knee: Secondary | ICD-10-CM

## 2016-11-16 LAB — GLUCOSE, CAPILLARY
GLUCOSE-CAPILLARY: 169 mg/dL — AB (ref 65–99)
GLUCOSE-CAPILLARY: 119 mg/dL — AB (ref 65–99)
Glucose-Capillary: 198 mg/dL — ABNORMAL HIGH (ref 65–99)
Glucose-Capillary: 177 mg/dL — ABNORMAL HIGH (ref 65–99)
Glucose-Capillary: 175 mg/dL — ABNORMAL HIGH (ref 65–99)

## 2016-11-16 LAB — COMPREHENSIVE METABOLIC PANEL
ALBUMIN: 1.8 g/dL — AB (ref 3.5–5.0)
ALT: 66 U/L — ABNORMAL HIGH (ref 17–63)
ANION GAP: 10 (ref 5–15)
AST: 140 U/L — ABNORMAL HIGH (ref 15–41)
Alkaline Phosphatase: 247 U/L — ABNORMAL HIGH (ref 38–126)
BUN: <5 mg/dL — ABNORMAL LOW (ref 6–20)
CHLORIDE: 103 mmol/L (ref 101–111)
CO2: 21 mmol/L — AB (ref 22–32)
Calcium: 7.3 mg/dL — ABNORMAL LOW (ref 8.9–10.3)
Creatinine, Ser: 0.55 mg/dL — ABNORMAL LOW (ref 0.61–1.24)
GFR calc non Af Amer: >60 mL/min (ref >60)
Glucose, Bld: 175 mg/dL — ABNORMAL HIGH (ref 65–99)
POTASSIUM: 3.1 mmol/L — AB (ref 3.5–5.1)
SODIUM: 134 mmol/L — AB (ref 135–145)
Total Bilirubin: 1.5 mg/dL — ABNORMAL HIGH (ref 0.3–1.2)
Total Protein: 5 g/dL — ABNORMAL LOW (ref 6.5–8.1)

## 2016-11-16 LAB — HEMOGLOBIN AND HEMATOCRIT, BLOOD
HEMATOCRIT: 20.5 % — AB (ref 39.0–52.0)
HEMOGLOBIN: 7 g/dL — AB (ref 13.0–17.0)

## 2016-11-16 LAB — CBC WITH DIFFERENTIAL/PLATELET
BASOS PCT: 1 %
Basophils Absolute: 0 10*3/uL (ref 0.0–0.1)
EOS ABS: 0.1 10*3/uL (ref 0.0–0.7)
EOS PCT: 2 %
HCT: 21.5 % — ABNORMAL LOW (ref 39.0–52.0)
Hemoglobin: 7.3 g/dL — ABNORMAL LOW (ref 13.0–17.0)
LYMPHS ABS: 0.9 10*3/uL (ref 0.7–4.0)
Lymphocytes Relative: 17 %
MCH: 30.3 pg (ref 26.0–34.0)
MCHC: 34 g/dL (ref 30.0–36.0)
MCV: 89.2 fL (ref 78.0–100.0)
MONOS PCT: 15 %
Monocytes Absolute: 0.8 10*3/uL (ref 0.1–1.0)
Neutro Abs: 3.3 10*3/uL (ref 1.7–7.7)
Neutrophils Relative %: 65 %
PLATELETS: 130 10*3/uL — AB (ref 150–400)
RBC: 2.41 MIL/uL — ABNORMAL LOW (ref 4.22–5.81)
RDW: 16.6 % — ABNORMAL HIGH (ref 11.5–15.5)
WBC: 5 10*3/uL (ref 4.0–10.5)

## 2016-11-16 LAB — TYPE AND SCREEN
Blood Product Expiration Date: 201802192359
Blood Product Expiration Date: 201802252359
ISSUE DATE / TIME: 201802050235
Unit Type and Rh: 6200
Unit Type and Rh: 8400

## 2016-11-16 LAB — AMMONIA: AMMONIA: 57 umol/L — AB (ref 9–35)

## 2016-11-16 LAB — URIC ACID: URIC ACID, SERUM: 5.8 mg/dL (ref 4.4–7.6)

## 2016-11-16 LAB — PHOSPHORUS: PHOSPHORUS: 1.6 mg/dL — AB (ref 2.5–4.6)

## 2016-11-16 LAB — MAGNESIUM: Magnesium: 1.7 mg/dL (ref 1.7–2.4)

## 2016-11-16 LAB — TSH: TSH: 5.351 u[IU]/mL — ABNORMAL HIGH (ref 0.350–4.500)

## 2016-11-16 MED ORDER — LORAZEPAM 2 MG/ML IJ SOLN
1.0000 mg | Freq: Four times a day (QID) | INTRAMUSCULAR | Status: AC | PRN
  Administered 2016-11-16 – 2016-11-18 (×4): 1 mg via INTRAVENOUS
  Filled 2016-11-16 (×4): qty 1

## 2016-11-16 MED ORDER — GI COCKTAIL ~~LOC~~: 30.0000 mL | Freq: Three times a day (TID) | ORAL | Status: DC | PRN

## 2016-11-16 MED ORDER — LORAZEPAM 2 MG/ML IJ SOLN: 0.0000 mg | Freq: Two times a day (BID) | INTRAMUSCULAR | Status: AC

## 2016-11-16 MED ORDER — LORAZEPAM 2 MG/ML IJ SOLN
0.0000 mg | Freq: Four times a day (QID) | INTRAMUSCULAR | Status: AC
  Administered 2016-11-17 (×2): 2 mg via INTRAVENOUS
  Administered 2016-11-17: 1 mg via INTRAVENOUS
  Filled 2016-11-16 (×3): qty 1

## 2016-11-16 MED ORDER — FOLIC ACID 1 MG PO TABS
1.0000 mg | ORAL_TABLET | Freq: Every day | ORAL | Status: DC
  Administered 2016-11-16: 1 mg via ORAL

## 2016-11-16 MED ORDER — ADULT MULTIVITAMIN W/MINERALS CH
1.0000 | ORAL_TABLET | Freq: Every day | ORAL | Status: DC
  Administered 2016-11-16 – 2016-11-20 (×5): 1 via ORAL
  Filled 2016-11-16 (×7): qty 1

## 2016-11-16 MED ORDER — THIAMINE HCL 100 MG/ML IJ SOLN: 100.0000 mg | Freq: Every day | INTRAMUSCULAR | Status: DC

## 2016-11-16 MED ORDER — POTASSIUM CHLORIDE CRYS ER 20 MEQ PO TBCR
40.0000 meq | EXTENDED_RELEASE_TABLET | ORAL | Status: AC
  Administered 2016-11-16 (×2): 40 meq via ORAL
  Filled 2016-11-16 (×2): qty 2

## 2016-11-16 MED ORDER — VITAMIN B-1 100 MG PO TABS
100.0000 mg | ORAL_TABLET | Freq: Every day | ORAL | Status: DC
  Administered 2016-11-18: 100 mg via ORAL
  Filled 2016-11-16 (×4): qty 1

## 2016-11-16 MED ORDER — NICOTINE 21 MG/24HR TD PT24
21.0000 mg | MEDICATED_PATCH | Freq: Every day | TRANSDERMAL | Status: DC
  Administered 2016-11-16 – 2016-11-22 (×7): 21 mg via TRANSDERMAL
  Filled 2016-11-16 (×7): qty 1

## 2016-11-16 MED ORDER — LORAZEPAM 1 MG PO TABS: 1.0000 mg | ORAL_TABLET | Freq: Four times a day (QID) | ORAL | Status: AC | PRN

## 2016-11-16 MED ORDER — SIMETHICONE 80 MG PO CHEW
160.0000 mg | CHEWABLE_TABLET | Freq: Four times a day (QID) | ORAL | Status: AC
Start: 2016-11-16 — End: 2016-11-19
  Administered 2016-11-16 – 2016-11-18 (×9): 160 mg via ORAL
  Filled 2016-11-16 (×9): qty 2

## 2016-11-16 MED ORDER — LACTULOSE 10 GM/15ML PO SOLN
20.0000 g | Freq: Every day | ORAL | Status: DC
  Administered 2016-11-17 – 2016-11-18 (×2): 20 g via ORAL
  Filled 2016-11-16 (×2): qty 30

## 2016-11-16 NOTE — Progress Notes (Signed)
CSW received consult regarding ETOH use. Patient reported that he drinks 1/2 cup to a cup of whiskey every day. He states that he does not think that is a problem for him. When CSW asked about alcohol use in relation to his health, patient responded "no more than other people". Patient stated he has been to a rehab program before and did not like it. He refused resources and stated he did not want to try a different program. Patient asked CSW to check the temperature of the room to see if it could be increased; CSW alerted patient that temperature was already at the highest possible.  CSW signing off.  Osborne Cascoadia Dent Plantz LCSWA [phone]

## 2016-11-16 NOTE — Progress Notes (Signed)
PROGRESS NOTE    [name]  ZOX:[id number] DOB: May 31, 1966 DOA: 11/12/2016 PCP: Lavell Islam, MD   Brief Narrative:  Jorge Baker a 51 y.o.malewith medical history significant of DM with resultant visual impairment, osteoporosis and ETOH abuse presenting because he feels like "something is sucking the life out of me." Has been feeling bad for the last 4-5 days. Noticed lack of energy first. Then total loss of energy. Unable to stand because he is so weak. No SOB. Whole body hurts, mostly the last 3 days. Chronic cough, no change fgrom baseline. No fevers. No abdominal pain. Doesn't look at stools, has not noticed color change. Has had loose stools for over a year. +n/v for a week or more, denies blood in it. For pain, takes oxycodone if it is really bad; if not too bad he just drinks more alcohol. Drinks 1/4-1/2 cup of alcohol all day every day. Prefers bourbon whiskey.  In SDU for hypoglycemia.  Has had both EGD and colonoscopy.  Now in withdrawal so will remain in SDU.   Assessment & Plan:   Principal Problem:   Symptomatic anemia Active Problems:   Upper GI bleeding   Diabetes mellitus, type 2 (HCC)   Alcohol abuse   Right knee pain   Gastritis and gastroduodenitis   Hemorrhoids   Hypomagnesemia   Hypophosphatemia   Thrombocytopenia (HCC)   Generalized weakness  #1 symptomatic anemia ? Etiology. Patient status post upper endoscopy which showed gastritis. Colonoscopy with both external and internal hemorrhoids. Status post 1 unit packed red blood cells. Patient currently off octreotide drip. Continue daily Protonix. Follow.  #2 alcoholic hepatitis/alcohol dependence with withdrawal Patient with a history of chronic alcohol dependence. Patient has failed rehabilitation in the past. Patient with some slight tremors and high risk for withdrawal including seizures and DTs. Will place patient on Ativan withdrawal protocol with scheduled Ativan. Continue  thiamine and folic acid. Continue Xifaxan and lactulose. Follow.  #3 hypokalemia/hypophosphatemia Replete.  #4 diabetes mellitus type 1 Hemoglobin A1c was 6.4 on 11/12/2016. Continue current dose of Lantus at 8 units daily. Sliding scale insulin.  #5 probable GERD PPI. GI cocktail as needed.  #6 knee pain Plain films negative. Uric acid within normal limits. Pain management. Follow.  #7 increased ammonia level Patient alert to self and place. Patient thinks he 2004. Continue Xifaxan and lactulose.  #8 sinus tachycardia Likely secondary to probable alcohol dependence/withdrawal. Continue the Ativan withdrawal protocol. Follow.  #9 thrombocytopenia Likely alcohol induced. No overt bleeding. Follow.    DVT prophylaxis: SCDs Code Status: Full Family Communication: Updated patient. No family present. Disposition Plan: Remain the step down unit. Final disposition pending PT evaluation.   Consultants:   Gastroenterology: Dr. Christella Hartigan 11/13/2016  Procedures:   Upper endoscopy 11/14/2016--- Dr. Christella Hartigan  Colonoscopy 11/15/2016----Dr. Christella Hartigan  Chest x-ray 11/12/2016,   Plain films of the right knee 11/13/2016  1 unit packed red blood cells 11/13/2016  Antimicrobials:  IV Rocephin 11/12/2016>>>> 11/14/2016   Subjective: Patient complaining of feeling full. Patient belching. Patient states he may have eating too much. Patient denies any shortness of breath. Patient complain of generalized weakness.  Objective: Vitals:   11/16/16 0333 11/16/16 0543 11/16/16 0816 11/16/16 0928  BP: 131/76  (!) 149/88   Pulse:    (!) 115  Resp: (!) 21 17 18    Temp: 98 F (36.7 C)  98 F (36.7 C)   TempSrc: Oral  Oral   SpO2: 98%  98%   Weight:  Height:        Intake/Output Summary (Last 24 hours) at 11/16/16 1054 Last data filed at 11/16/16 0700  Gross per 24 hour  Intake          1324.17 ml  Output                0 ml  Net          1324.17 ml   Filed Weights   11/12/16  1210 11/12/16 1808 11/15/16 0330  Weight: 53.5 kg (118 lb) 54.6 kg (120 lb 5.9 oz) 61.4 kg (135 lb 6.4 oz)    Examination:  General exam: Appears calm and comfortable.Slight tremors. Respiratory system: Clear to auscultation. Respiratory effort normal. Cardiovascular system: Tachycardia. No JVD, murmurs, rubs, gallops or clicks. 1-2+ lower extremity edema. Gastrointestinal system: Abdomen is nondistended, soft and nontender. No organomegaly or masses felt. Normal bowel sounds heard. Central nervous system: Alert and oriented. No focal neurological deficits. Extremities: Symmetric 5 x 5 power. Skin: No rashes, lesions or ulcers Psychiatry: Judgement and insight appear normal. Mood & affect appropriate.     Data Reviewed: I have personally reviewed following labs and imaging studies  CBC:  Recent Labs Lab 11/12/16 1235 11/13/16 1250 11/14/16 0242 11/15/16 0227 11/16/16 0835  WBC 7.4 6.8 6.5 5.7 5.0  NEUTROABS 6.1  --   --   --  3.3  HGB 7.0* 7.3*  7.4* 7.2* 7.1* 7.3*  HCT 20.2* 21.5*  21.4* 20.6* 20.7* 21.5*  MCV 90.6 90.0 89.6 88.8 89.2  PLT 73* 60* 72* 94* 130*   Basic Metabolic Panel:  Recent Labs Lab 11/12/16 1235 11/14/16 0242 11/15/16 0227 11/16/16 0835  NA 133* 134* 130* 134*  K 4.3 3.8 3.5 3.1*  CL 99* 101 97* 103  CO2 19* 22 21* 21*  GLUCOSE 91 81 264* 175*  BUN 20 10 <5* <5*  CREATININE 0.53* 0.60* 0.65 0.55*  CALCIUM 7.9* 7.2* 7.0* 7.3*  MG  --   --  1.2* 1.7  PHOS  --   --  1.1* 1.6*   GFR: Estimated Creatinine Clearance: 93.5 mL/min (by C-G formula based on SCr of 0.55 mg/dL (L)). Liver Function Tests:  Recent Labs Lab 11/12/16 1235 11/14/16 0242 11/16/16 0835  AST 181* 209* 140*  ALT 68* 61 66*  ALKPHOS 306* 233* 247*  BILITOT 1.0 1.0 1.5*  PROT 5.6* 4.8* 5.0*  ALBUMIN 2.2* 1.8* 1.8*    Recent Labs Lab 11/12/16 1235  LIPASE 26    Recent Labs Lab 11/13/16 1250 11/16/16 0835  AMMONIA 60* 57*   Coagulation  Profile:  Recent Labs Lab 11/12/16 2007 11/13/16 1250  INR 1.12 1.14   Cardiac Enzymes: No results for input(s): CKTOTAL, CKMB, CKMBINDEX, TROPONINI in the last 168 hours. BNP (last 3 results) No results for input(s): PROBNP in the last 8760 hours. HbA1C: No results for input(s): HGBA1C in the last 72 hours. CBG:  Recent Labs Lab 11/15/16 0809 11/15/16 1145 11/15/16 1621 11/15/16 2100 11/16/16 0815  GLUCAP 257* 246* 134* 173* 169*   Lipid Profile: No results for input(s): CHOL, HDL, LDLCALC, TRIG, CHOLHDL, LDLDIRECT in the last 72 hours. Thyroid Function Tests: No results for input(s): TSH, T4TOTAL, FREET4, T3FREE, THYROIDAB in the last 72 hours. Anemia Panel: No results for input(s): VITAMINB12, FOLATE, FERRITIN, TIBC, IRON, RETICCTPCT in the last 72 hours. Sepsis Labs: No results for input(s): PROCALCITON, LATICACIDVEN in the last 168 hours.  Recent Results (from the past 240 hour(s))  Urine culture  Status: Abnormal   Collection Time: 11/12/16  2:55 PM  Result Value Ref Range Status   Specimen Description URINE, CLEAN CATCH  Final   Special Requests NONE  Final   Culture (A)  Final    <10,000 COLONIES/mL INSIGNIFICANT GROWTH Performed at [facility], [address] [place], [place] [address]    Report Status 11/14/2016 FINAL  Final  MRSA PCR Screening     Status: None   Collection Time: 11/14/16 12:24 AM  Result Value Ref Range Status   MRSA by PCR NEGATIVE NEGATIVE Final    Comment:        The GeneXpert MRSA Assay (FDA approved for NASAL specimens only), is one component of a comprehensive MRSA colonization surveillance program. It is not intended to diagnose MRSA infection nor to guide or monitor treatment for MRSA infections.          Radiology Studies: No results found.      Scheduled Meds: . folic acid  1 mg Oral Daily  . insulin aspart  0-5 Units Subcutaneous QHS  . insulin aspart  0-9 Units Subcutaneous TID WC  .  insulin glargine  8 Units Subcutaneous QHS  . lactulose  20 g Oral BID  . LORazepam  0-4 mg Intravenous Q6H   Followed by  . [START ON 11/18/2016] LORazepam  0-4 mg Intravenous Q12H  . multivitamin with minerals  1 tablet Oral Daily  . pantoprazole  40 mg Oral Q0600  . potassium & sodium phosphates  1 packet Oral TID WC & HS  . potassium chloride  40 mEq Oral Q4H  . sodium chloride flush  3 mL Intravenous Q12H  . thiamine  100 mg Oral Daily  . thiamine  100 mg Oral Daily   Continuous Infusions:   LOS: 3 days    Time spent: 40 mins    Herminio Kniskern, MD [facility] Pager [phone]  If 7PM-7AM, please contact night-coverage [email or web address] Password [email or web address] 11/16/2016, 10:54 AM

## 2016-11-16 NOTE — Progress Notes (Signed)
Pt gave permission to update his mother, Sonny MastersCandace on his care. She states pt called her last night 3 times before 0600 this am asking for cigarettes, she's requesting something to help with his nicotine withdrawal.   Pt had order in for transfer to medsurg bed, noted MD notes today plan to keep on SDU due to withdrawals, notified Dr Janee Mornhompson of transfer order that was in prior to today for medsurg bed, but his notes indicated to stay on SDU.  Pt will remain on SDU today, reassess tomorrow. MD will order nicotine patch for pt. Also reassessing Hgb this evening.

## 2016-11-17 ENCOUNTER — Inpatient Hospital Stay (HOSPITAL_COMMUNITY): Payer: Medicare HMO

## 2016-11-17 DIAGNOSIS — N5089 Other specified disorders of the male genital organs: Secondary | ICD-10-CM | POA: Clinically undetermined

## 2016-11-17 DIAGNOSIS — R000 Tachycardia, unspecified: Secondary | ICD-10-CM

## 2016-11-17 LAB — GLUCOSE, CAPILLARY
GLUCOSE-CAPILLARY: 35 mg/dL — AB (ref 65–99)
GLUCOSE-CAPILLARY: 79 mg/dL (ref 65–99)
Glucose-Capillary: 119 mg/dL — ABNORMAL HIGH (ref 65–99)
Glucose-Capillary: 139 mg/dL — ABNORMAL HIGH (ref 65–99)
Glucose-Capillary: 165 mg/dL — ABNORMAL HIGH (ref 65–99)
Glucose-Capillary: 194 mg/dL — ABNORMAL HIGH (ref 65–99)
Glucose-Capillary: 203 mg/dL — ABNORMAL HIGH (ref 65–99)
Glucose-Capillary: 81 mg/dL (ref 65–99)

## 2016-11-17 LAB — CBC WITH DIFFERENTIAL/PLATELET
BASOS ABS: 0 10*3/uL (ref 0.0–0.1)
Basophils Relative: 0 %
EOS PCT: 1 %
Eosinophils Absolute: 0.1 10*3/uL (ref 0.0–0.7)
HEMATOCRIT: 21.8 % — AB (ref 39.0–52.0)
Hemoglobin: 7.3 g/dL — ABNORMAL LOW (ref 13.0–17.0)
LYMPHS PCT: 16 %
Lymphs Abs: 0.9 10*3/uL (ref 0.7–4.0)
MCH: 30.2 pg (ref 26.0–34.0)
MCHC: 33.5 g/dL (ref 30.0–36.0)
MCV: 90.1 fL (ref 78.0–100.0)
MONO ABS: 0.8 10*3/uL (ref 0.1–1.0)
MONOS PCT: 15 %
NEUTROS ABS: 3.5 10*3/uL (ref 1.7–7.7)
Neutrophils Relative %: 68 %
PLATELETS: 140 10*3/uL — AB (ref 150–400)
RBC: 2.42 MIL/uL — ABNORMAL LOW (ref 4.22–5.81)
RDW: 17.5 % — AB (ref 11.5–15.5)
WBC: 5.2 10*3/uL (ref 4.0–10.5)

## 2016-11-17 LAB — BASIC METABOLIC PANEL
ANION GAP: 9 (ref 5–15)
BUN: 6 mg/dL (ref 6–20)
CO2: 21 mmol/L — AB (ref 22–32)
CREATININE: 0.65 mg/dL (ref 0.61–1.24)
Calcium: 7.8 mg/dL — ABNORMAL LOW (ref 8.9–10.3)
Chloride: 104 mmol/L (ref 101–111)
GFR calc Af Amer: >60 mL/min (ref >60)
GLUCOSE: 174 mg/dL — AB (ref 65–99)
Potassium: 4.4 mmol/L (ref 3.5–5.1)
Sodium: 134 mmol/L — ABNORMAL LOW (ref 135–145)

## 2016-11-17 LAB — PHOSPHORUS: Phosphorus: 1.8 mg/dL — ABNORMAL LOW (ref 2.5–4.6)

## 2016-11-17 LAB — HEMOGLOBIN AND HEMATOCRIT, BLOOD
HEMATOCRIT: 32.1 % — AB (ref 39.0–52.0)
Hemoglobin: 11 g/dL — ABNORMAL LOW (ref 13.0–17.0)

## 2016-11-17 LAB — PREPARE RBC (CROSSMATCH)

## 2016-11-17 LAB — MAGNESIUM: Magnesium: 1.7 mg/dL (ref 1.7–2.4)

## 2016-11-17 MED ORDER — MAGNESIUM SULFATE 4 GM/100ML IV SOLN
4.0000 g | Freq: Once | INTRAVENOUS | Status: AC
  Administered 2016-11-17: 4 g via INTRAVENOUS
  Filled 2016-11-17: qty 100

## 2016-11-17 MED ORDER — DIPHENHYDRAMINE HCL 25 MG PO CAPS
25.0000 mg | ORAL_CAPSULE | Freq: Once | ORAL | Status: AC
  Administered 2016-11-17: 25 mg via ORAL
  Filled 2016-11-17: qty 1

## 2016-11-17 MED ORDER — ACETAMINOPHEN 325 MG PO TABS
650.0000 mg | ORAL_TABLET | Freq: Once | ORAL | Status: AC
  Administered 2016-11-17: 650 mg via ORAL
  Filled 2016-11-17: qty 2

## 2016-11-17 MED ORDER — DEXTROSE 50 % IV SOLN
25.0000 g | Freq: Once | INTRAVENOUS | Status: AC
Start: 2016-11-17 — End: 2016-11-17
  Administered 2016-11-17: 25 g via INTRAVENOUS

## 2016-11-17 MED ORDER — FUROSEMIDE 10 MG/ML IJ SOLN
20.0000 mg | Freq: Once | INTRAMUSCULAR | Status: AC
  Administered 2016-11-17: 20 mg via INTRAVENOUS
  Filled 2016-11-17: qty 2

## 2016-11-17 MED ORDER — METOPROLOL TARTRATE 5 MG/5ML IV SOLN
5.0000 mg | Freq: Once | INTRAVENOUS | Status: AC
  Administered 2016-11-17: 5 mg via INTRAVENOUS
  Filled 2016-11-17: qty 5

## 2016-11-17 MED ORDER — SODIUM CHLORIDE 0.9 % IV SOLN: Freq: Once | INTRAVENOUS | Status: DC

## 2016-11-17 MED ORDER — DEXTROSE 50 % IV SOLN
INTRAVENOUS | Status: AC
  Administered 2016-11-17: 25 g via INTRAVENOUS
  Filled 2016-11-17: qty 50

## 2016-11-17 NOTE — Evaluation (Signed)
Occupational Therapy Evaluation Patient Details Name: Jorge Baker MRN: [id number] DOB: 05/31/1966 Today's Date: 11/17/2016    History of Present Illness Jorge ChimesJoseph Baker is a 51 y.o. male with medical history significant of DM 1 with resultant visual impairment, osteoporosis and ETOH abuse (daily) presenting because he feels like "something is sucking the life out of me."/weakness. Found to have a Hgb in 7's on admission with upper and lower GI peformed without probable cause.   Clinical Impression   This 51 yo male admitted with above presents to acute OT with deficits below (see OT problem list) thus affecting his PLOF of what he reports as Independent. Pt is extremely weak all over and he will benefit from acute OT with follow up OT at SNF to get back to his PLOF.    Follow Up Recommendations  SNF;Supervision/Assistance - 24 hour    Equipment Recommendations  Other (comment) (TBD at next venue)       Precautions / Restrictions Precautions Precautions: Fall Precaution Comments: scrotal area swollen, red, sore Restrictions Weight Bearing Restrictions: No      Mobility Bed Mobility Overal bed mobility: Needs Assistance Bed Mobility: Rolling;Sidelying to Sit;Sit to Supine Rolling: Max assist Sidelying to sit: Max assist   Sit to supine: Max assist;+2 for physical assistance   General bed mobility comments: Cues for sequencing as well  Transfers Overall transfer level: Needs assistance Equipment used: 2 person hand held assist Transfers: Sit to/from Stand           General transfer comment: Attempted sit>stand from bed x2 with pt only barely able to clear his bottom off of bed with +2 total A    Balance Overall balance assessment: Needs assistance Sitting-balance support: Bilateral upper extremity supported;Feet supported Sitting balance-Leahy Scale: Poor   Postural control:  (leaning varied)                                  ADL Overall ADL's  : Needs assistance/impaired Eating/Feeding: Total assistance;Bed level   Grooming:  (supported sitting EOB) Grooming Details (indicate cue type and reason): got washcloth up to face but then did not wash face with increased effort, just held it in place Upper Body Bathing: Total assistance;Bed level   Lower Body Bathing: Total assistance;Bed level   Upper Body Dressing : Total assistance;Bed level   Lower Body Dressing: Total assistance;Bed level     Toilet Transfer Details (indicate cue type and reason): unable to attempt transfer due to pt only able to barely clear his bottom off of bed for sit>stand Toileting- Clothing Manipulation and Hygiene: Total assistance;Bed level Toileting - Clothing Manipulation Details (indicate cue type and reason): rolling left and right             Vision Additional Comments: reports he wears glasses but they do not help a whole lot. Had a lens removed from one eye. Chart reports vision issues due to DM1          Pertinent Vitals/Pain Pain Assessment: 0-10 Pain Score: 8  Pain Location: all over Pain Intervention(s): Monitored during session;Repositioned     Hand Dominance Right   Extremity/Trunk Assessment Upper Extremity Assessment Upper Extremity Assessment: Generalized weakness (pt with 2/5 strength throughout)   Lower Extremity Assessment Lower Extremity Assessment: Defer to PT evaluation       Communication Communication Communication: No difficulties   Cognition Arousal/Alertness: Lethargic Behavior During Therapy: Flat affect Overall Cognitive Status:  Impaired/Different from baseline Area of Impairment: Orientation;Following commands;Safety/judgement;Problem solving Orientation Level: Time;Disoriented to (2017)     Following Commands: Follows one step commands with increased time Safety/Judgement: Decreased awareness of safety;Decreased awareness of deficits   Problem Solving: Slow processing;Decreased  initiation;Requires verbal cues;Difficulty sequencing;Requires tactile cues                Home Living Family/patient expects to be discharged to:: Private residence Living Arrangements: Parent   Type of Home: House Home Access: Ramped entrance     Home Layout: One level                          Prior Functioning/Environment Level of Independence: Independent                 OT Problem List: Decreased strength;Decreased range of motion;Decreased activity tolerance;Impaired vision/perception;Impaired balance (sitting and/or standing);Decreased coordination;Decreased cognition;Decreased safety awareness;Decreased knowledge of use of DME or AE;Impaired UE functional use;Pain   OT Treatment/Interventions: Self-care/ADL training;Therapeutic exercise;Therapeutic activities;Patient/family education;DME and/or AE instruction;Balance training    OT Goals(Current goals can be found in the care plan section) Acute Rehab OT Goals OT Goal Formulation: With patient Time For Goal Achievement: 12/01/16 Potential to Achieve Goals: Good  OT Frequency: Min 2X/week   Barriers to D/C: Decreased caregiver support          Co-evaluation PT/OT/SLP Co-Evaluation/Treatment: Yes Reason for Co-Treatment: For patient/therapist safety   OT goals addressed during session: Strengthening/ROM      End of Session Equipment Utilized During Treatment: Gait belt  Activity Tolerance: Patient limited by fatigue;Patient limited by lethargy Patient left: in bed;with call bell/phone within reach;with bed alarm set   Time: 4098-1191 OT Time Calculation (min): 21 min Charges:  OT General Charges $OT Visit: 1 Procedure OT Evaluation $OT Eval High Complexity: 1 Procedure  Evette Georges [phone] 11/17/2016, 1:07 PM

## 2016-11-17 NOTE — Progress Notes (Signed)
PROGRESS NOTE    Jorge Baker  ZOX:[id number] DOB: 1966/05/06 DOA: 11/12/2016 PCP: Lavell Islam, MD   Brief Narrative:  Jorge Baker a 51 y.o.malewith medical history significant of DM with resultant visual impairment, osteoporosis and ETOH abuse presenting because he feels like "something is sucking the life out of me." Has been feeling bad for the last 4-5 days. Noticed lack of energy first. Then total loss of energy. Unable to stand because he is so weak. No SOB. Whole body hurts, mostly the last 3 days. Chronic cough, no change fgrom baseline. No fevers. No abdominal pain. Doesn't look at stools, has not noticed color change. Has had loose stools for over a year. +n/v for a week or more, denies blood in it. For pain, takes oxycodone if it is really bad; if not too bad he just drinks more alcohol. Drinks 1/4-1/2 cup of alcohol all day every day. Prefers bourbon whiskey.  In SDU for hypoglycemia.  Has had both EGD and colonoscopy.  Now in withdrawal so will remain in SDU.   Assessment & Plan:   Principal Problem:   Symptomatic anemia Active Problems:   Upper GI bleeding   Diabetes mellitus, type 2 (HCC)   Alcohol abuse   Right knee pain   Gastritis and gastroduodenitis   Hemorrhoids   Hypomagnesemia   Hypophosphatemia   Thrombocytopenia (HCC)   Generalized weakness   Scrotal swelling  #1 symptomatic anemia ? Etiology. Patient status post upper endoscopy which showed gastritis. Colonoscopy with both external and internal hemorrhoids. Status post 1 unit packed red blood cells. Patient currently off octreotide drip. Patient noted to be tachycardic and hemoglobin currently at 7.3. Will transfuse 2 more units of packed red blood cells and follow. Continue daily Protonix. Follow.  #2 alcoholic hepatitis/alcohol dependence with withdrawal Patient with a history of chronic alcohol dependence. Patient also noted to have distended abdomen which is tight.  Patient has failed rehabilitation in the past. Patient with some slight tremors and high risk for withdrawal including seizures and DTs. Continue Ativan withdrawal protocol with scheduled Ativan. Continue thiamine and folic acid. Continue Xifaxan and lactulose. Will check abdominal ultrasound to rule out ascites. If positive for ascites will need a diagnostic and therapeutic paracentesis. Follow.  #3 hypokalemia/hypophosphatemia Repleted potassium. Continue to replete phosphate..  #4 diabetes mellitus type 1 Hemoglobin A1c was 6.4 on 11/12/2016. CBGs have ranged from 119 - 194. Continue current dose of Lantus at 8 units daily. Sliding scale insulin.  #5 probable GERD PPI. GI cocktail as needed.  #6 knee pain Plain films negative. Uric acid within normal limits. Pain management. Follow.  #7 increased ammonia level Patient alert to self and place. Patient thinks he 2004. Continue Xifaxan and lactulose. Lactulose dose decreased secondary to increased bowel movements. Repeat ammonia level in the morning.  #8 sinus tachycardia Likely secondary to probable alcohol dependence/withdrawal versus symptomatic anemia. Continue the Ativan withdrawal protocol. Hemoglobin at 7.3 today. Transfuse 2 units packed red blood cells and follow. Follow.  #9 thrombocytopenia Likely alcohol induced. No overt bleeding. Improving daily. Follow.  #10 scrotal swelling Likely secondary to dependent edema. Check a scrotal ultrasound. Elevate scrotum.  #11 abdominal distention Check abdominal ultrasound to rule out ascites as patient with a history of alcoholic cirrhosis.    DVT prophylaxis: SCDs Code Status: Full Family Communication: Updated patient. No family present. Disposition Plan: Remain the step down unit. Final disposition pending PT evaluation, likely skilled nursing facility.   Consultants:   Gastroenterology: Dr. Christella Hartigan 11/13/2016  Procedures:   Upper endoscopy 11/14/2016--- Dr.  Christella HartiganJacobs  Colonoscopy 11/15/2016----Dr. Christella HartiganJacobs  Chest x-ray 11/12/2016,   Plain films of the right knee 11/13/2016  1 unit packed red blood cells 11/13/2016  2 units packed red blood cells pending 4 11/17/2016  Antimicrobials:  IV Rocephin 11/12/2016>>>> 11/14/2016   Subjective: Patient denies any chest pain, no shortness of breath. Patient with generalized weakness. Patient with some tremors.   Objective: Vitals:   11/17/16 0800 11/17/16 0826 11/17/16 0900 11/17/16 1000  BP: (!) 144/92 (!) 144/92    Pulse:      Resp: 20 (!) 29 (!) 21 (!) 25  Temp:  98.7 F (37.1 C)    TempSrc:  Oral    SpO2:      Weight:      Height:        Intake/Output Summary (Last 24 hours) at 11/17/16 1047 Last data filed at 11/17/16 1011  Gross per 24 hour  Intake              330 ml  Output              150 ml  Net              180 ml   Filed Weights   11/12/16 1210 11/12/16 1808 11/15/16 0330  Weight: 53.5 kg (118 lb) 54.6 kg (120 lb 5.9 oz) 61.4 kg (135 lb 6.4 oz)    Examination:  General exam: Appears calm and comfortable.Slight tremors. Respiratory system: Clear to auscultation anterior lung fields. Respiratory effort normal. Cardiovascular system: Tachycardia. No JVD, murmurs, rubs, gallops or clicks. 1+ lower extremity edema. Gastrointestinal system: Abdomen is distended, tight and nontender. No organomegaly or masses felt. Normal bowel sounds heard. Central nervous system: Alert and oriented. No focal neurological deficits. Extremities: Symmetric 5 x 5 power. Skin: Ecchymosis noted over bilateral upper extremities. Psychiatry: Judgement and insight appear normal. Mood & affect appropriate.  GU: Scrotal swelling.    Data Reviewed: I have personally reviewed following labs and imaging studies  CBC:  Recent Labs Lab 11/12/16 1235 11/13/16 1250 11/14/16 0242 11/15/16 0227 11/16/16 0835 11/16/16 1826 11/17/16 0335  WBC 7.4 6.8 6.5 5.7 5.0  --  5.2  NEUTROABS 6.1  --    --   --  3.3  --  3.5  HGB 7.0* 7.3*  7.4* 7.2* 7.1* 7.3* 7.0* 7.3*  HCT 20.2* 21.5*  21.4* 20.6* 20.7* 21.5* 20.5* 21.8*  MCV 90.6 90.0 89.6 88.8 89.2  --  90.1  PLT 73* 60* 72* 94* 130*  --  140*   Basic Metabolic Panel:  Recent Labs Lab 11/12/16 1235 11/14/16 0242 11/15/16 0227 11/16/16 0835 11/17/16 0335  NA 133* 134* 130* 134* 134*  K 4.3 3.8 3.5 3.1* 4.4  CL 99* 101 97* 103 104  CO2 19* 22 21* 21* 21*  GLUCOSE 91 81 264* 175* 174*  BUN 20 10 <5* <5* 6  CREATININE 0.53* 0.60* 0.65 0.55* 0.65  CALCIUM 7.9* 7.2* 7.0* 7.3* 7.8*  MG  --   --  1.2* 1.7 1.7  PHOS  --   --  1.1* 1.6* 1.8*   GFR: Estimated Creatinine Clearance: 93.5 mL/min (by C-G formula based on SCr of 0.65 mg/dL). Liver Function Tests:  Recent Labs Lab 11/12/16 1235 11/14/16 0242 11/16/16 0835  AST 181* 209* 140*  ALT 68* 61 66*  ALKPHOS 306* 233* 247*  BILITOT 1.0 1.0 1.5*  PROT 5.6* 4.8* 5.0*  ALBUMIN 2.2* 1.8* 1.8*  Recent Labs Lab 11/12/16 1235  LIPASE 26    Recent Labs Lab 11/13/16 1250 11/16/16 0835  AMMONIA 60* 57*   Coagulation Profile:  Recent Labs Lab 11/12/16 2007 11/13/16 1250  INR 1.12 1.14   Cardiac Enzymes: No results for input(s): CKTOTAL, CKMB, CKMBINDEX, TROPONINI in the last 168 hours. BNP (last 3 results) No results for input(s): PROBNP in the last 8760 hours. HbA1C: No results for input(s): HGBA1C in the last 72 hours. CBG:  Recent Labs Lab 11/16/16 2018 11/16/16 2159 11/17/16 0008 11/17/16 0412 11/17/16 0829  GLUCAP 177* 175* 194* 165* 119*   Lipid Profile: No results for input(s): CHOL, HDL, LDLCALC, TRIG, CHOLHDL, LDLDIRECT in the last 72 hours. Thyroid Function Tests:  Recent Labs  11/16/16 1826  TSH 5.351*   Anemia Panel: No results for input(s): VITAMINB12, FOLATE, FERRITIN, TIBC, IRON, RETICCTPCT in the last 72 hours. Sepsis Labs: No results for input(s): PROCALCITON, LATICACIDVEN in the last 168 hours.  Recent Results (from  the past 240 hour(s))  Urine culture     Status: Abnormal   Collection Time: 11/12/16  2:55 PM  Result Value Ref Range Status   Specimen Description URINE, CLEAN CATCH  Final   Special Requests NONE  Final   Culture (A)  Final    <10,000 COLONIES/mL INSIGNIFICANT GROWTH Performed at [facility], [address], [place], [place] [address]    Report Status 11/14/2016 FINAL  Final  MRSA PCR Screening     Status: None   Collection Time: 11/14/16 12:24 AM  Result Value Ref Range Status   MRSA by PCR NEGATIVE NEGATIVE Final    Comment:        The GeneXpert MRSA Assay (FDA approved for NASAL specimens only), is one component of a comprehensive MRSA colonization surveillance program. It is not intended to diagnose MRSA infection nor to guide or monitor treatment for MRSA infections.          Radiology Studies: No results found.      Scheduled Meds: . sodium chloride   Intravenous Once  . folic acid  1 mg Oral Daily  . insulin aspart  0-5 Units Subcutaneous QHS  . insulin aspart  0-9 Units Subcutaneous TID WC  . insulin glargine  8 Units Subcutaneous QHS  . lactulose  20 g Oral Daily  . LORazepam  0-4 mg Intravenous Q6H   Followed by  . [START ON 11/18/2016] LORazepam  0-4 mg Intravenous Q12H  . magnesium sulfate 1 - 4 g bolus IVPB  4 g Intravenous Once  . multivitamin with minerals  1 tablet Oral Daily  . nicotine  21 mg Transdermal Daily  . pantoprazole  40 mg Oral Q0600  . potassium & sodium phosphates  1 packet Oral TID WC & HS  . simethicone  160 mg Oral QID  . sodium chloride flush  3 mL Intravenous Q12H  . thiamine  100 mg Oral Daily  . thiamine  100 mg Oral Daily   Continuous Infusions:   LOS: 4 days    Time spent: 40 mins    THOMPSON,DANIEL, MD [facility] Pager [phone]  If 7PM-7AM, please contact night-coverage [email or web address] Password [email or web address] 11/17/2016, 10:47 AM

## 2016-11-17 NOTE — Evaluation (Signed)
Physical Therapy Evaluation Patient Details Name: Jorge Baker MRN: [id number] DOB: Aug 05, 1966 Today's Date: 11/17/2016   History of Present Illness  Jorge Baker is a 51 y.o. male with medical history significant of DM 1 with resultant visual impairment, osteoporosis and ETOH abuse (daily) presenting because he feels like "something is sucking the life out of me."/weakness. Found to have a Hgb in 7's on admission with upper and lower GI peformed without probable cause.  Clinical Impression  Pt admitted with above diagnosis and presents to PT with functional limitations due to deficits listed below (See PT problem list). Pt needs skilled PT to maximize independence and safety to allow discharge to SNF. Pt with significant deficits in strength, balance, and cognition that make it difficult for him to safely mobilize. Doubt parents can manage him at this level at home.     Follow Up Recommendations SNF    Equipment Recommendations  Other (comment) (To be determined)    Recommendations for Other Services       Precautions / Restrictions Precautions Precautions: Fall Precaution Comments: scrotal area swollen, red, sore Restrictions Weight Bearing Restrictions: No      Mobility  Bed Mobility Overal bed mobility: Needs Assistance Bed Mobility: Rolling;Sidelying to Sit;Sit to Supine Rolling: Max assist Sidelying to sit: Max assist   Sit to supine: Max assist;+2 for physical assistance   General bed mobility comments: Cues for sequencing as well  Transfers Overall transfer level: Needs assistance Equipment used: 2 person hand held assist Transfers: Sit to/from Stand           General transfer comment: Attempted sit>stand from bed x2 with pt only barely able to clear his bottom off of bed with +2 total A  Ambulation/Gait                Stairs            Wheelchair Mobility    Modified Rankin (Stroke Patients Only)       Balance Overall balance  assessment: Needs assistance Sitting-balance support: Bilateral upper extremity supported;Feet supported Sitting balance-Leahy Scale: Poor Sitting balance - Comments: Sat EOB x 10 minutes with min A Postural control:  (leaning varied)                                   Pertinent Vitals/Pain Pain Assessment: 0-10 Pain Score: 8  Pain Location: all over Pain Intervention(s): Monitored during session;Repositioned    Home Living Family/patient expects to be discharged to:: Private residence Living Arrangements: Parent   Type of Home: House Home Access: Ramped entrance     Home Layout: One level   Additional Comments: Pt unable to state further details    Prior Function Level of Independence: Independent               Hand Dominance   Dominant Hand: Right    Extremity/Trunk Assessment   Upper Extremity Assessment Upper Extremity Assessment: Defer to OT evaluation    Lower Extremity Assessment Lower Extremity Assessment: RLE deficits/detail;LLE deficits/detail RLE Deficits / Details: Difficult to assess due to cognition. Appears grossly 2+/5 with ankles possibly less. LLE Deficits / Details: Difficult to assess due to cognition. Appears grossly 2+/5 with ankles possibly less.       Communication   Communication: No difficulties  Cognition Arousal/Alertness: Lethargic Behavior During Therapy: Flat affect Overall Cognitive Status: Impaired/Different from baseline Area of Impairment: Orientation;Following commands;Safety/judgement;Problem solving Orientation Level: Time;Disoriented  to (2017)     Following Commands: Follows one step commands with increased time Safety/Judgement: Decreased awareness of safety;Decreased awareness of deficits   Problem Solving: Slow processing;Decreased initiation;Requires verbal cues;Difficulty sequencing;Requires tactile cues      General Comments      Exercises     Assessment/Plan    PT Assessment Patient  needs continued PT services  PT Problem List Decreased strength;Decreased activity tolerance;Decreased balance;Decreased mobility;Decreased cognition;Decreased knowledge of use of DME;Decreased safety awareness          PT Treatment Interventions DME instruction;Gait training;Therapeutic activities;Functional mobility training;Therapeutic exercise;Balance training;Patient/family education    PT Goals (Current goals can be found in the Care Plan section)  Acute Rehab PT Goals Patient Stated Goal: Pt didn't state PT Goal Formulation: Patient unable to participate in goal setting Time For Goal Achievement: 12/01/16 Potential to Achieve Goals: Fair    Frequency Min 3X/week   Barriers to discharge        Co-evaluation PT/OT/SLP Co-Evaluation/Treatment: Yes Reason for Co-Treatment: For patient/therapist safety PT goals addressed during session: Mobility/safety with mobility OT goals addressed during session: Strengthening/ROM       End of Session Equipment Utilized During Treatment: Gait belt Activity Tolerance: Patient limited by lethargy;Patient limited by fatigue Patient left: in bed;with call bell/phone within reach;with bed alarm set Nurse Communication: Mobility status         Time: [id number] PT Time Calculation (min) (ACUTE ONLY): 23 min   Charges:   PT Evaluation $PT Eval Moderate Complexity: 1 Procedure     PT G CodesAngelina Ok:        Kamil Mchaffie W Maycok 11/17/2016, 3:08 PM [employer] Gatha Mcnulty PT [phone]

## 2016-11-17 NOTE — Plan of Care (Signed)
Problem: Health Behavior/Discharge Planning: Goal: Ability to manage health-related needs will improve Outcome: Not Progressing Patient does not demonstrate appropriate self-management skills as evidenced by patient repeatedly asking for novolog insulin 1 1/2 units periodically throughout the night. Discussed with patient the sliding scale used at the hospital, patient states "just give me 1 1/2 units." Encouraged patient to adhere to the sliding scale he has been given for better blood sugar management.   Problem: Skin Integrity: Goal: Risk for impaired skin integrity will decrease Outcome: Not Progressing Risk factors include: bowel & bladder incontinence, immobility and poor skin integrity on admission. Prophylactic sacral foam dressing is in place; barrier cream applied to perineum and scrotum.   Problem: Activity: Goal: Risk for activity intolerance will decrease Outcome: Not Progressing PT to evaluate patient today

## 2016-11-18 DIAGNOSIS — R140 Abdominal distension (gaseous): Secondary | ICD-10-CM

## 2016-11-18 LAB — COMPREHENSIVE METABOLIC PANEL
ALT: 60 U/L (ref 17–63)
ANION GAP: 10 (ref 5–15)
AST: 97 U/L — ABNORMAL HIGH (ref 15–41)
Albumin: 1.9 g/dL — ABNORMAL LOW (ref 3.5–5.0)
Alkaline Phosphatase: 269 U/L — ABNORMAL HIGH (ref 38–126)
BILIRUBIN TOTAL: 2.9 mg/dL — AB (ref 0.3–1.2)
CO2: 20 mmol/L — ABNORMAL LOW (ref 22–32)
Calcium: 7.6 mg/dL — ABNORMAL LOW (ref 8.9–10.3)
Chloride: 104 mmol/L (ref 101–111)
Creatinine, Ser: 0.53 mg/dL — ABNORMAL LOW (ref 0.61–1.24)
GFR calc Af Amer: >60 mL/min (ref >60)
Glucose, Bld: 116 mg/dL — ABNORMAL HIGH (ref 65–99)
POTASSIUM: 4 mmol/L (ref 3.5–5.1)
Sodium: 134 mmol/L — ABNORMAL LOW (ref 135–145)
TOTAL PROTEIN: 5.8 g/dL — AB (ref 6.5–8.1)

## 2016-11-18 LAB — CBC WITH DIFFERENTIAL/PLATELET
BASOS ABS: 0 10*3/uL (ref 0.0–0.1)
Basophils Relative: 0 %
EOS ABS: 0.1 10*3/uL (ref 0.0–0.7)
Eosinophils Relative: 1 %
HCT: 34.1 % — ABNORMAL LOW (ref 39.0–52.0)
HEMOGLOBIN: 12.1 g/dL — AB (ref 13.0–17.0)
LYMPHS ABS: 1.3 10*3/uL (ref 0.7–4.0)
Lymphocytes Relative: 19 %
MCH: 31.3 pg (ref 26.0–34.0)
MCHC: 35.5 g/dL (ref 30.0–36.0)
MCV: 88.1 fL (ref 78.0–100.0)
MONOS PCT: 17 %
Monocytes Absolute: 1.2 10*3/uL — ABNORMAL HIGH (ref 0.1–1.0)
Neutro Abs: 4.2 10*3/uL (ref 1.7–7.7)
Neutrophils Relative %: 63 %
PLATELETS: 103 10*3/uL — AB (ref 150–400)
RBC: 3.87 MIL/uL — AB (ref 4.22–5.81)
RDW: 17 % — AB (ref 11.5–15.5)
WBC: 6.8 10*3/uL (ref 4.0–10.5)

## 2016-11-18 LAB — TYPE AND SCREEN
Blood Product Expiration Date: 201802242359
Blood Product Expiration Date: 201802242359
ISSUE DATE / TIME: 201802091501
ISSUE DATE / TIME: 201802091652
UNIT TYPE AND RH: 8400
Unit Type and Rh: 8400

## 2016-11-18 LAB — PHOSPHORUS: PHOSPHORUS: 2.4 mg/dL — AB (ref 2.5–4.6)

## 2016-11-18 LAB — MAGNESIUM: MAGNESIUM: 2.2 mg/dL (ref 1.7–2.4)

## 2016-11-18 LAB — GLUCOSE, CAPILLARY
GLUCOSE-CAPILLARY: 140 mg/dL — AB (ref 65–99)
Glucose-Capillary: 105 mg/dL — ABNORMAL HIGH (ref 65–99)
Glucose-Capillary: 83 mg/dL (ref 65–99)
Glucose-Capillary: 144 mg/dL — ABNORMAL HIGH (ref 65–99)

## 2016-11-18 LAB — AMMONIA: Ammonia: 81 umol/L — ABNORMAL HIGH (ref 9–35)

## 2016-11-18 MED ORDER — RIFAXIMIN 550 MG PO TABS
550.0000 mg | ORAL_TABLET | Freq: Two times a day (BID) | ORAL | Status: DC
  Administered 2016-11-18 – 2016-11-21 (×7): 550 mg via ORAL
  Filled 2016-11-18 (×10): qty 1

## 2016-11-18 MED ORDER — LACTULOSE 10 GM/15ML PO SOLN
20.0000 g | ORAL | Status: DC
Start: 2016-11-20 — End: 2016-11-22
  Administered 2016-11-20: 20 g via ORAL
  Filled 2016-11-18 (×2): qty 30

## 2016-11-18 MED ORDER — METOPROLOL TARTRATE 12.5 MG HALF TABLET
12.5000 mg | ORAL_TABLET | Freq: Two times a day (BID) | ORAL | Status: DC
  Administered 2016-11-18 – 2016-11-21 (×7): 12.5 mg via ORAL
  Filled 2016-11-18 (×9): qty 1

## 2016-11-18 MED ORDER — FUROSEMIDE 10 MG/ML IJ SOLN
40.0000 mg | Freq: Two times a day (BID) | INTRAMUSCULAR | Status: DC
  Administered 2016-11-18: 40 mg via INTRAVENOUS
  Filled 2016-11-18: qty 4

## 2016-11-18 MED ORDER — FUROSEMIDE 10 MG/ML IJ SOLN
40.0000 mg | Freq: Two times a day (BID) | INTRAMUSCULAR | Status: AC
  Administered 2016-11-18: 40 mg via INTRAVENOUS
  Filled 2016-11-18: qty 4

## 2016-11-18 NOTE — Progress Notes (Signed)
PROGRESS NOTE    Jorge Baker  ZOX:[id number] DOB: 1966/08/21 DOA: 11/12/2016 PCP: Lavell Islam, MD   Brief Narrative:  Jorge Baker a 51 y.o.malewith medical history significant of DM with resultant visual impairment, osteoporosis and ETOH abuse presenting because he feels like "something is sucking the life out of me." Has been feeling bad for the last 4-5 days. Noticed lack of energy first. Then total loss of energy. Unable to stand because he is so weak. No SOB. Whole body hurts, mostly the last 3 days. Chronic cough, no change fgrom baseline. No fevers. No abdominal pain. Doesn't look at stools, has not noticed color change. Has had loose stools for over a year. +n/v for a week or more, denies blood in it. For pain, takes oxycodone if it is really bad; if not too bad he just drinks more alcohol. Drinks 1/4-1/2 cup of alcohol all day every day. Prefers bourbon whiskey.  In SDU for hypoglycemia.  Has had both EGD and colonoscopy.  Now in withdrawal so will remain in SDU.   Assessment & Plan:   Principal Problem:   Symptomatic anemia Active Problems:   Upper GI bleeding   Diabetes mellitus, type 2 (HCC)   Alcohol abuse   Right knee pain   Gastritis and gastroduodenitis   Hemorrhoids   Hypomagnesemia   Hypophosphatemia   Thrombocytopenia (HCC)   Generalized weakness   Scrotal swelling   Tachycardia  #1 symptomatic anemia ? Etiology. Patient status post upper endoscopy which showed gastritis. Colonoscopy with both external and internal hemorrhoids. Status post 1 unit packed red blood cells. Patient currently off octreotide drip. Patient noted to be tachycardic and hemoglobin at 7.3 on 11/17/2016. Patient transfuse 2 more units of packed red blood cells on 11/17/2016 current hemoglobin at 12.1. Continue daily Protonix. Follow.  #2 alcoholic hepatitis/alcohol dependence with withdrawal Patient with a history of chronic alcohol dependence. Patient also  noted to have distended abdomen which was tight, however improving. Patient has failed rehabilitation in the past. Patient with some slight tremors and high risk for withdrawal including seizures and DTs. Continue Ativan withdrawal protocol with scheduled Ativan. Continue thiamine and folic acid. Change lactulose to every other day due to significant multiple stools. Start Xifaxan. Abdominal ultrasound with small abdominal ascites no evidence of acute cholecystitis, moderate amount of gallbladder sludge, no focal hepatic mass. Follow.  #3 hypokalemia/hypophosphatemia Repleted potassium. Continue to replete phosphate..  #4 diabetes mellitus type 1 Hemoglobin A1c was 6.4 on 11/12/2016. CBGs have ranged from 79 - 140. Discontinue Lantus. Sliding scale insulin.  #5 probable GERD PPI. GI cocktail as needed.  #6 knee pain Plain films negative. Uric acid within normal limits. Pain management. Follow.  #7 increased ammonia level Patient alert to self and place. Clinical improvement. Patient with multiple stools on lactulose and as such will change lactulose to every other day. Start patient on xifaxin which was not ordered initially.   #8 sinus tachycardia Likely secondary to probable alcohol dependence/withdrawal versus symptomatic anemia. Continue the Ativan withdrawal protocol. Patient status post 2 units packed red blood cells. Patient still tachycardic. Place on low-dose beta blocker.  #9 thrombocytopenia Likely alcohol induced. No overt bleeding. Improving daily. Follow.  #10 scrotal swelling Likely secondary to dependent edema. Scrotal ultrasound with pronounced skin thickening/edema about the scrotum. Both testicles appear normal without focal mass or lesion. No evidence of testicular torsion or orchitis. No evidence of epididymitis. Patient with a normal white count. Patient afebrile.  #11 abdominal distention Abdominal ultrasound with  small volume abdominal ascites, no cholelithiasis or  sonographic evidence of acute cholecystitis, moderate amount of gallbladder sludge, no focal hepatic mass.     DVT prophylaxis: SCDs Code Status: Full Family Communication: Updated patient. No family present. Disposition Plan: Remain in the step down unit due to probable withdrawal and tachycardia. Final disposition pending PT evaluation, likely skilled nursing facility.   Consultants:   Gastroenterology: Dr. Christella Hartigan 11/13/2016  Procedures:   Upper endoscopy 11/14/2016--- Dr. Christella Hartigan  Colonoscopy 11/15/2016----Dr. Christella Hartigan  Chest x-ray 11/12/2016,   Plain films of the right knee 11/13/2016  1 unit packed red blood cells 11/13/2016  2 units packed red blood cells 11/17/2016  Abdominal ultrasound 11/17/2016  Scrotal ultrasound 11/17/2016  Antimicrobials:  IV Rocephin 11/12/2016>>>> 11/14/2016   Subjective: Patient denies any chest pain, no shortness of breath. Patient with generalized weakness.   Objective: Vitals:   11/18/16 0300 11/18/16 0400 11/18/16 0600 11/18/16 0801  BP: (!) 144/95 (!) 158/98  (!) 135/100  Pulse:  (!) 107 (!) 117 95  Resp: 12 17    Temp: 97.9 F (36.6 C)   98.3 F (36.8 C)  TempSrc: Oral   Oral  SpO2:    98%  Weight:      Height:        Intake/Output Summary (Last 24 hours) at 11/18/16 1100 Last data filed at 11/18/16 0000  Gross per 24 hour  Intake              650 ml  Output              300 ml  Net              350 ml   Filed Weights   11/12/16 1210 11/12/16 1808 11/15/16 0330  Weight: 53.5 kg (118 lb) 54.6 kg (120 lb 5.9 oz) 61.4 kg (135 lb 6.4 oz)    Examination:  General exam: Appears calm and comfortable.Slight tremors. Respiratory system: Clear to auscultation anterior lung fields. Respiratory effort normal. Cardiovascular system: Tachycardia. No JVD, murmurs, rubs, gallops or clicks. Trace-+ lower extremity edema. Gastrointestinal system: Abdomen is distended, less tight and nontender. No organomegaly or masses felt.  Normal bowel sounds heard. Central nervous system: Alert and oriented. No focal neurological deficits. Extremities: Symmetric 5 x 5 power. Skin: Ecchymosis noted over bilateral upper extremities. Psychiatry: Judgement and insight appear fair. Mood & affect appropriate.  GU: Scrotal swelling.    Data Reviewed: I have personally reviewed following labs and imaging studies  CBC:  Recent Labs Lab 11/12/16 1235  11/14/16 0242 11/15/16 0227 11/16/16 0835 11/16/16 1826 11/17/16 0335 11/17/16 2124 11/18/16 0302  WBC 7.4  < > 6.5 5.7 5.0  --  5.2  --  6.8  NEUTROABS 6.1  --   --   --  3.3  --  3.5  --  4.2  HGB 7.0*  < > 7.2* 7.1* 7.3* 7.0* 7.3* 11.0* 12.1*  HCT 20.2*  < > 20.6* 20.7* 21.5* 20.5* 21.8* 32.1* 34.1*  MCV 90.6  < > 89.6 88.8 89.2  --  90.1  --  88.1  PLT 73*  < > 72* 94* 130*  --  140*  --  103*  < > = values in this interval not displayed. Basic Metabolic Panel:  Recent Labs Lab 11/14/16 0242 11/15/16 0227 11/16/16 0835 11/17/16 0335 11/18/16 0302  NA 134* 130* 134* 134* 134*  K 3.8 3.5 3.1* 4.4 4.0  CL 101 97* 103 104 104  CO2 22 21*  21* 21* 20*  GLUCOSE 81 264* 175* 174* 116*  BUN 10 <5* <5* 6 <5*  CREATININE 0.60* 0.65 0.55* 0.65 0.53*  CALCIUM 7.2* 7.0* 7.3* 7.8* 7.6*  MG  --  1.2* 1.7 1.7 2.2  PHOS  --  1.1* 1.6* 1.8* 2.4*   GFR: Estimated Creatinine Clearance: 93.5 mL/min (by C-G formula based on SCr of 0.53 mg/dL (L)). Liver Function Tests:  Recent Labs Lab 11/12/16 1235 11/14/16 0242 11/16/16 0835 11/18/16 0302  AST 181* 209* 140* 97*  ALT 68* 61 66* 60  ALKPHOS 306* 233* 247* 269*  BILITOT 1.0 1.0 1.5* 2.9*  PROT 5.6* 4.8* 5.0* 5.8*  ALBUMIN 2.2* 1.8* 1.8* 1.9*    Recent Labs Lab 11/12/16 1235  LIPASE 26    Recent Labs Lab 11/13/16 1250 11/16/16 0835 11/18/16 0302  AMMONIA 60* 57* 81*   Coagulation Profile:  Recent Labs Lab 11/12/16 2007 11/13/16 1250  INR 1.12 1.14   Cardiac Enzymes: No results for input(s):  CKTOTAL, CKMB, CKMBINDEX, TROPONINI in the last 168 hours. BNP (last 3 results) No results for input(s): PROBNP in the last 8760 hours. HbA1C: No results for input(s): HGBA1C in the last 72 hours. CBG:  Recent Labs Lab 11/17/16 1704 11/17/16 1731 11/17/16 2138 11/17/16 2349 11/18/16 0800  GLUCAP 35* 81 79 139* 140*   Lipid Profile: No results for input(s): CHOL, HDL, LDLCALC, TRIG, CHOLHDL, LDLDIRECT in the last 72 hours. Thyroid Function Tests:  Recent Labs  11/16/16 1826  TSH 5.351*   Anemia Panel: No results for input(s): VITAMINB12, FOLATE, FERRITIN, TIBC, IRON, RETICCTPCT in the last 72 hours. Sepsis Labs: No results for input(s): PROCALCITON, LATICACIDVEN in the last 168 hours.  Recent Results (from the past 240 hour(s))  Urine culture     Status: Abnormal   Collection Time: 11/12/16  2:55 PM  Result Value Ref Range Status   Specimen Description URINE, CLEAN CATCH  Final   Special Requests NONE  Final   Culture (A)  Final    <10,000 COLONIES/mL INSIGNIFICANT GROWTH Performed at [facility], [address] [place], [place] [address]    Report Status 11/14/2016 FINAL  Final  MRSA PCR Screening     Status: None   Collection Time: 11/14/16 12:24 AM  Result Value Ref Range Status   MRSA by PCR NEGATIVE NEGATIVE Final    Comment:        The GeneXpert MRSA Assay (FDA approved for NASAL specimens only), is one component of a comprehensive MRSA colonization surveillance program. It is not intended to diagnose MRSA infection nor to guide or monitor treatment for MRSA infections.          Radiology Studies: [place] Abdomen Complete  Result Date: 11/17/2016 CLINICAL DATA:  Abdominal distension, scrotal swelling EXAM: ABDOMEN ULTRASOUND COMPLETE COMPARISON:  None. FINDINGS: Gallbladder: No gallstones or pericholecystic fluid. Mild gallbladder wall thickening measuring 4.5 mm. Moderate amount of gallbladder sludge. No sonographic Murphy sign noted by  sonographer. Common bile duct: Diameter: 3.2 mm Liver: No focal lesion identified. Within normal limits in parenchymal echogenicity. IVC: No abnormality visualized, but evaluation is limited secondary to overlying bowel gas. Pancreas: Visualized portion unremarkable. Pancreatic tail is obscured by overlying bowel gas. Spleen: Size and appearance within normal limits. Right Kidney: Length: 10.9 cm. Echogenicity within normal limits. No mass or hydronephrosis visualized. Left Kidney: Length: 10.8 cm. Echogenicity within normal limits. No mass or hydronephrosis visualized. Abdominal aorta: No aneurysm visualized. Other findings: Small volume abdominal ascites. Bilateral small  pleural effusions. IMPRESSION: 1. Small volume abdominal ascites. 2. No cholelithiasis or sonographic evidence of acute cholecystitis. Moderate amount of gallbladder sludge. 3. No focal hepatic mass. Electronically Signed   By: Elige Ko   On: 11/17/2016 16:40   US Scrotum  Result Date: 11/17/2016 CLINICAL DATA:  Scrotal swelling since this morning. EXAM: ULTRASOUND OF SCROTUM TECHNIQUE: Complete ultrasound examination of the testicles, epididymis, and other scrotal structures was performed. COMPARISON:  None. FINDINGS: Right testicle Measurements: 3.2 x 2.4 x 2.5 cm. No mass or microlithiasis visualized. Normal/symmetric blood flow demonstrated within the right testicle. Left testicle Measurements: 3.4 x 2.5 x 2.2 cm. No mass or microlithiasis visualized. Normal/symmetric blood flow demonstrated within the left testicle. Right epididymis:  Normal in size and appearance. Left epididymis:  Normal in size and appearance. Hydrocele:  None visualized. Varicocele:  None visualized. There is pronounced skin thickening/edema about the scrotum. IMPRESSION: 1. Pronounced skin thickening/edema about the scrotum. Recommend clinical correlation and possibly a CT pelvis to exclude Fournier gangrene. 2. Both testicles appear normal without focal mass or  lesion. No evidence of testicular torsion or orchitis. 3. No evidence of epididymitis. Electronically Signed   By: Bary Richard M.D.   On: 11/17/2016 16:27        Scheduled Meds: . sodium chloride   Intravenous Once  . folic acid  1 mg Oral Daily  . furosemide  40 mg Intravenous BID  . insulin aspart  0-5 Units Subcutaneous QHS  . insulin aspart  0-9 Units Subcutaneous TID WC  . lactulose  20 g Oral Daily  . LORazepam  0-4 mg Intravenous Q12H  . multivitamin with minerals  1 tablet Oral Daily  . nicotine  21 mg Transdermal Daily  . pantoprazole  40 mg Oral Q0600  . potassium & sodium phosphates  1 packet Oral TID WC & HS  . simethicone  160 mg Oral QID  . sodium chloride flush  3 mL Intravenous Q12H  . thiamine  100 mg Oral Daily  . thiamine  100 mg Oral Daily   Continuous Infusions:   LOS: 5 days    Time spent: 40 mins    Dajour Pierpoint, MD [facility] Pager [phone]  If 7PM-7AM, please contact night-coverage [email or web address] Password [email or web address] 11/18/2016, 11:00 AM

## 2016-11-19 DIAGNOSIS — Z79899 Other long term (current) drug therapy: Secondary | ICD-10-CM

## 2016-11-19 DIAGNOSIS — F1099 Alcohol use, unspecified with unspecified alcohol-induced disorder: Secondary | ICD-10-CM

## 2016-11-19 DIAGNOSIS — F17210 Nicotine dependence, cigarettes, uncomplicated: Secondary | ICD-10-CM

## 2016-11-19 LAB — BASIC METABOLIC PANEL
ANION GAP: 15 (ref 5–15)
BUN: <5 mg/dL — ABNORMAL LOW (ref 6–20)
CALCIUM: 7.9 mg/dL — AB (ref 8.9–10.3)
CO2: 20 mmol/L — ABNORMAL LOW (ref 22–32)
Chloride: 102 mmol/L (ref 101–111)
Creatinine, Ser: 0.67 mg/dL (ref 0.61–1.24)
Glucose, Bld: 212 mg/dL — ABNORMAL HIGH (ref 65–99)
Potassium: 4.2 mmol/L (ref 3.5–5.1)
SODIUM: 137 mmol/L (ref 135–145)

## 2016-11-19 LAB — CBC
HCT: 37.7 % — ABNORMAL LOW (ref 39.0–52.0)
Hemoglobin: 13.1 g/dL (ref 13.0–17.0)
MCH: 31 pg (ref 26.0–34.0)
MCHC: 34.7 g/dL (ref 30.0–36.0)
MCV: 89.3 fL (ref 78.0–100.0)
PLATELETS: 110 10*3/uL — AB (ref 150–400)
RBC: 4.22 MIL/uL (ref 4.22–5.81)
RDW: 17.8 % — AB (ref 11.5–15.5)
WBC: 7.7 10*3/uL (ref 4.0–10.5)

## 2016-11-19 LAB — PHOSPHORUS: PHOSPHORUS: 3.2 mg/dL (ref 2.5–4.6)

## 2016-11-19 LAB — GLUCOSE, CAPILLARY
Glucose-Capillary: 261 mg/dL — ABNORMAL HIGH (ref 65–99)
Glucose-Capillary: 316 mg/dL — ABNORMAL HIGH (ref 65–99)
Glucose-Capillary: 343 mg/dL — ABNORMAL HIGH (ref 65–99)
Glucose-Capillary: 419 mg/dL — ABNORMAL HIGH (ref 65–99)

## 2016-11-19 LAB — MAGNESIUM: MAGNESIUM: 1.8 mg/dL (ref 1.7–2.4)

## 2016-11-19 MED ORDER — MAGNESIUM OXIDE 400 (241.3 MG) MG PO TABS
400.0000 mg | ORAL_TABLET | Freq: Two times a day (BID) | ORAL | Status: DC
  Administered 2016-11-19 – 2016-11-21 (×4): 400 mg via ORAL
  Filled 2016-11-19 (×7): qty 1

## 2016-11-19 MED ORDER — MAGNESIUM SULFATE 2 GM/50ML IV SOLN
2.0000 g | Freq: Once | INTRAVENOUS | Status: AC
  Administered 2016-11-19: 2 g via INTRAVENOUS
  Filled 2016-11-19: qty 50

## 2016-11-19 MED ORDER — FUROSEMIDE 10 MG/ML IJ SOLN
40.0000 mg | Freq: Two times a day (BID) | INTRAMUSCULAR | Status: DC
  Administered 2016-11-19 – 2016-11-20 (×3): 40 mg via INTRAVENOUS
  Filled 2016-11-19 (×3): qty 4

## 2016-11-19 NOTE — Progress Notes (Addendum)
PROGRESS NOTE    Jorge Baker  AVW:[id number] DOB: [birth year] DOA: 11/12/2016 PCP: Lavell Islam, MD   Brief Narrative:  Jorge Baker a 51 y.o.malewith medical history significant of DM with resultant visual impairment, osteoporosis and ETOH abuse presenting because he feels like "something is sucking the life out of me." Has been feeling bad for the last 4-5 days. Noticed lack of energy first. Then total loss of energy. Unable to stand because he is so weak. No SOB. Whole body hurts, mostly the last 3 days. Chronic cough, no change fgrom baseline. No fevers. No abdominal pain. Doesn't look at stools, has not noticed color change. Has had loose stools for over a year. +n/v for a week or more, denies blood in it. For pain, takes oxycodone if it is really bad; if not too bad he just drinks more alcohol. Drinks 1/4-1/2 cup of alcohol all day every day. Prefers bourbon whiskey.  In SDU for hypoglycemia.  Has had both EGD and colonoscopy.  Now in withdrawal so will remain in SDU.   Assessment & Plan:   Principal Problem:   Symptomatic anemia Active Problems:   Upper GI bleeding   Diabetes mellitus, type 2 (HCC)   Alcohol abuse   Right knee pain   Gastritis and gastroduodenitis   Hemorrhoids   Hypomagnesemia   Hypophosphatemia   Thrombocytopenia (HCC)   Generalized weakness   Scrotal swelling   Tachycardia   Abdominal distension  #1 symptomatic anemia ? Etiology. Patient status post upper endoscopy which showed gastritis. Colonoscopy with both external and internal hemorrhoids. Status post 1 unit packed red blood cells. Patient currently off octreotide drip. Patient noted to be tachycardic and hemoglobin at 7.3 on 11/17/2016. Patient transfused 2 more units of packed red blood cells on 11/17/2016 current hemoglobin at 13.1. Continue daily Protonix. Follow.  #2 alcoholic hepatitis/alcohol dependence with withdrawal Patient with a history of chronic alcohol  dependence. Patient also noted to have distended abdomen which was tight, however improving. Patient has failed rehabilitation in the past. Patient with some slight tremors and high risk for withdrawal including seizures and DTs. Continue Ativan withdrawal protocol with scheduled Ativan. Continue thiamine and folic acid. Change lactulose to every other day due to significant multiple stools. Start Xifaxan. Abdominal ultrasound with small abdominal ascites no evidence of acute cholecystitis, moderate amount of gallbladder sludge, no focal hepatic mass. Lasix 40mg  IV q12 x 4 doses. Follow.  #3 hypokalemia/hypophosphatemia Repleted potassium. Continue to replete phosphate..  #4 diabetes mellitus type 1 Hemoglobin A1c was 6.4 on 11/12/2016. CBGs have ranged from 83 - 261. Discontinued Lantus. Sliding scale insulin.  #5 probable GERD PPI. GI cocktail as needed.  #6 knee pain Plain films negative. Uric acid within normal limits. Pain management. Follow.  #7 increased ammonia level Patient alert to self and place. Clinical improvement. Patient with multiple stools on lactulose and as such will change lactulose to every other day. Started patient on xifaxin which was not ordered initially.   #8 sinus tachycardia Likely secondary to probable alcohol dependence/withdrawal versus symptomatic anemia. Improving after start of beta blocker. Continue the Ativan withdrawal protocol. Patient status post 2 units packed red blood cells. Patient less tachycardic. Continue low-dose beta blocker.  #9 thrombocytopenia Likely alcohol induced. No overt bleeding. Improving daily. Follow.  #10 scrotal swelling Likely secondary to dependent edema. Improving with diureses. Scrotal ultrasound with pronounced skin thickening/edema about the scrotum. Both testicles appear normal without focal mass or lesion. No evidence of testicular torsion or orchitis.  No evidence of epididymitis. Patient with a normal white count.  Patient afebrile.Lasix Iv.   #11 abdominal distention Abdominal ultrasound with small volume abdominal ascites, no cholelithiasis or sonographic evidence of acute cholecystitis, moderate amount of gallbladder sludge, no focal hepatic mass.   #12 ? Depression Per nursing patient's mother requesting psychiatric consultation for assessment for possible depression.    DVT prophylaxis: SCDs Code Status: Full Family Communication: Updated patient. No family present. Disposition Plan: Transfer to telemetry. Final disposition pending PT evaluation, likely skilled nursing facility.   Consultants:   Gastroenterology: Dr. Christella Hartigan 11/13/2016  Procedures:   Upper endoscopy 11/14/2016--- Dr. Christella Hartigan  Colonoscopy 11/15/2016----Dr. Christella Hartigan  Chest x-ray 11/12/2016,   Plain films of the right knee 11/13/2016  1 unit packed red blood cells 11/13/2016  2 units packed red blood cells 11/17/2016  Abdominal ultrasound 11/17/2016  Scrotal ultrasound 11/17/2016  Antimicrobials:  IV Rocephin 11/12/2016>>>> 11/14/2016   Subjective: Patient denies any chest pain, no shortness of breath. Patient with generalized weakness. Patient states he's lost his phone.  Objective: Vitals:   11/19/16 0000 11/19/16 0400 11/19/16 0423 11/19/16 0814  BP: 133/89 130/86 130/86 135/81  Pulse: (!) 101 (!) 102 91 (!) 104  Resp: (!) 23 18 12    Temp:   98.1 F (36.7 C) 98.3 F (36.8 C)  TempSrc:   Oral Oral  SpO2: 96% 100% 100% 100%  Weight:   60.1 kg (132 lb 8 oz)   Height:        Intake/Output Summary (Last 24 hours) at 11/19/16 1142 Last data filed at 11/18/16 1830  Gross per 24 hour  Intake              120 ml  Output                0 ml  Net              120 ml   Filed Weights   11/12/16 1808 11/15/16 0330 11/19/16 0423  Weight: 54.6 kg (120 lb 5.9 oz) 61.4 kg (135 lb 6.4 oz) 60.1 kg (132 lb 8 oz)    Examination:  General exam: Appears calm and comfortable.Slight tremors. Respiratory  system: Clear to auscultation anterior lung fields. Respiratory effort normal. Cardiovascular system: Tachycardia. No JVD, murmurs, rubs, gallops or clicks. Trace-1+ lower extremity edema. Gastrointestinal system: Abdomen is less distended, less tight and nontender. No organomegaly or masses felt. Normal bowel sounds heard. Central nervous system: Alert and oriented. No focal neurological deficits. Extremities: Symmetric 5 x 5 power. Skin: Ecchymosis noted over bilateral upper extremities. Psychiatry: Judgement and insight appear fair. Mood & affect appropriate.  GU: Scrotal swelling slowly improving.    Data Reviewed: I have personally reviewed following labs and imaging studies  CBC:  Recent Labs Lab 11/12/16 1235  11/15/16 0227 11/16/16 0835 11/16/16 1826 11/17/16 0335 11/17/16 2124 11/18/16 0302 11/19/16 0222  WBC 7.4  < > 5.7 5.0  --  5.2  --  6.8 7.7  NEUTROABS 6.1  --   --  3.3  --  3.5  --  4.2  --   HGB 7.0*  < > 7.1* 7.3* 7.0* 7.3* 11.0* 12.1* 13.1  HCT 20.2*  < > 20.7* 21.5* 20.5* 21.8* 32.1* 34.1* 37.7*  MCV 90.6  < > 88.8 89.2  --  90.1  --  88.1 89.3  PLT 73*  < > 94* 130*  --  140*  --  103* 110*  < > = values in this interval  not displayed. Basic Metabolic Panel:  Recent Labs Lab 11/15/16 0227 11/16/16 0835 11/17/16 0335 11/18/16 0302 11/19/16 0222  NA 130* 134* 134* 134* 137  K 3.5 3.1* 4.4 4.0 4.2  CL 97* 103 104 104 102  CO2 21* 21* 21* 20* 20*  GLUCOSE 264* 175* 174* 116* 212*  BUN <5* <5* 6 <5* <5*  CREATININE 0.65 0.55* 0.65 0.53* 0.67  CALCIUM 7.0* 7.3* 7.8* 7.6* 7.9*  MG 1.2* 1.7 1.7 2.2 1.8  PHOS 1.1* 1.6* 1.8* 2.4* 3.2   GFR: Estimated Creatinine Clearance: 93.5 mL/min (by C-G formula based on SCr of 0.67 mg/dL). Liver Function Tests:  Recent Labs Lab 11/12/16 1235 11/14/16 0242 11/16/16 0835 11/18/16 0302  AST 181* 209* 140* 97*  ALT 68* 61 66* 60  ALKPHOS 306* 233* 247* 269*  BILITOT 1.0 1.0 1.5* 2.9*  PROT 5.6* 4.8* 5.0*  5.8*  ALBUMIN 2.2* 1.8* 1.8* 1.9*    Recent Labs Lab 11/12/16 1235  LIPASE 26    Recent Labs Lab 11/13/16 1250 11/16/16 0835 11/18/16 0302  AMMONIA 60* 57* 81*   Coagulation Profile:  Recent Labs Lab 11/12/16 2007 11/13/16 1250  INR 1.12 1.14   Cardiac Enzymes: No results for input(s): CKTOTAL, CKMB, CKMBINDEX, TROPONINI in the last 168 hours. BNP (last 3 results) No results for input(s): PROBNP in the last 8760 hours. HbA1C: No results for input(s): HGBA1C in the last 72 hours. CBG:  Recent Labs Lab 11/18/16 0800 11/18/16 1158 11/18/16 1557 11/18/16 2141 11/19/16 0816  GLUCAP 140* 105* 83 144* 261*   Lipid Profile: No results for input(s): CHOL, HDL, LDLCALC, TRIG, CHOLHDL, LDLDIRECT in the last 72 hours. Thyroid Function Tests:  Recent Labs  11/16/16 1826  TSH 5.351*   Anemia Panel: No results for input(s): VITAMINB12, FOLATE, FERRITIN, TIBC, IRON, RETICCTPCT in the last 72 hours. Sepsis Labs: No results for input(s): PROCALCITON, LATICACIDVEN in the last 168 hours.  Recent Results (from the past 240 hour(s))  Urine culture     Status: Abnormal   Collection Time: 11/12/16  2:55 PM  Result Value Ref Range Status   Specimen Description URINE, CLEAN CATCH  Final   Special Requests NONE  Final   Culture (A)  Final    <10,000 COLONIES/mL INSIGNIFICANT GROWTH Performed at [facility], [address] [place], [place] [address]    Report Status 11/14/2016 FINAL  Final  MRSA PCR Screening     Status: None   Collection Time: 11/14/16 12:24 AM  Result Value Ref Range Status   MRSA by PCR NEGATIVE NEGATIVE Final    Comment:        The GeneXpert MRSA Assay (FDA approved for NASAL specimens only), is one component of a comprehensive MRSA colonization surveillance program. It is not intended to diagnose MRSA infection nor to guide or monitor treatment for MRSA infections.          Radiology Studies: [place] Abdomen Complete  Result  Date: 11/17/2016 CLINICAL DATA:  Abdominal distension, scrotal swelling EXAM: ABDOMEN ULTRASOUND COMPLETE COMPARISON:  None. FINDINGS: Gallbladder: No gallstones or pericholecystic fluid. Mild gallbladder wall thickening measuring 4.5 mm. Moderate amount of gallbladder sludge. No sonographic Murphy sign noted by sonographer. Common bile duct: Diameter: 3.2 mm Liver: No focal lesion identified. Within normal limits in parenchymal echogenicity. IVC: No abnormality visualized, but evaluation is limited secondary to overlying bowel gas. Pancreas: Visualized portion unremarkable. Pancreatic tail is obscured by overlying bowel gas. Spleen: Size and appearance within normal limits. Right Kidney:  Length: 10.9 cm. Echogenicity within normal limits. No mass or hydronephrosis visualized. Left Kidney: Length: 10.8 cm. Echogenicity within normal limits. No mass or hydronephrosis visualized. Abdominal aorta: No aneurysm visualized. Other findings: Small volume abdominal ascites. Bilateral small pleural effusions. IMPRESSION: 1. Small volume abdominal ascites. 2. No cholelithiasis or sonographic evidence of acute cholecystitis. Moderate amount of gallbladder sludge. 3. No focal hepatic mass. Electronically Signed   By: Elige Ko   On: 11/17/2016 16:40   US Scrotum  Result Date: 11/17/2016 CLINICAL DATA:  Scrotal swelling since this morning. EXAM: ULTRASOUND OF SCROTUM TECHNIQUE: Complete ultrasound examination of the testicles, epididymis, and other scrotal structures was performed. COMPARISON:  None. FINDINGS: Right testicle Measurements: 3.2 x 2.4 x 2.5 cm. No mass or microlithiasis visualized. Normal/symmetric blood flow demonstrated within the right testicle. Left testicle Measurements: 3.4 x 2.5 x 2.2 cm. No mass or microlithiasis visualized. Normal/symmetric blood flow demonstrated within the left testicle. Right epididymis:  Normal in size and appearance. Left epididymis:  Normal in size and appearance. Hydrocele:   None visualized. Varicocele:  None visualized. There is pronounced skin thickening/edema about the scrotum. IMPRESSION: 1. Pronounced skin thickening/edema about the scrotum. Recommend clinical correlation and possibly a CT pelvis to exclude Fournier gangrene. 2. Both testicles appear normal without focal mass or lesion. No evidence of testicular torsion or orchitis. 3. No evidence of epididymitis. Electronically Signed   By: Bary Richard M.D.   On: 11/17/2016 16:27        Scheduled Meds: . sodium chloride   Intravenous Once  . folic acid  1 mg Oral Daily  . furosemide  40 mg Intravenous Q12H  . insulin aspart  0-5 Units Subcutaneous QHS  . insulin aspart  0-9 Units Subcutaneous TID WC  . [START ON 11/20/2016] lactulose  20 g Oral QODAY  . LORazepam  0-4 mg Intravenous Q12H  . metoprolol tartrate  12.5 mg Oral BID  . multivitamin with minerals  1 tablet Oral Daily  . nicotine  21 mg Transdermal Daily  . pantoprazole  40 mg Oral Q0600  . potassium & sodium phosphates  1 packet Oral TID WC & HS  . rifaximin  550 mg Oral BID  . sodium chloride flush  3 mL Intravenous Q12H  . thiamine  100 mg Oral Daily  . thiamine  100 mg Oral Daily   Continuous Infusions:   LOS: 6 days    Time spent: 35 mins    Drae Mitzel, MD [facility] Pager [phone]  If 7PM-7AM, please contact night-coverage [email or web address] Password [facility] 11/19/2016, 11:42 AM

## 2016-11-19 NOTE — Consult Note (Addendum)
[facility] Consult   Reason for Consult:  Depression  Referring Physician:  Dr. Grandville Silos Patient Identification: Jorge Baker MRN:  [id number] Principal Diagnosis: Symptomatic anemia Diagnosis:   Patient Active Problem List   Diagnosis Date Noted  . Abdominal distension [R14.0]   . Scrotal swelling [N50.89] 11/17/2016  . Tachycardia [R00.0]   . Hypomagnesemia [E83.42] 11/16/2016  . Hypophosphatemia [E83.39] 11/16/2016  . Thrombocytopenia ([place]) [D69.6] 11/16/2016  . Generalized weakness [R53.1]   . Hemorrhoids [K64.9]   . Gastritis and gastroduodenitis [K29.70, K38.90]   . Symptomatic anemia [D64.9] 11/12/2016  . Upper GI bleeding [K92.2] 11/12/2016  . Diabetes mellitus, type 2 ([place]) [E11.9] 11/12/2016  . Alcohol abuse [F10.10] 11/12/2016  . Right knee pain [M25.561] 11/12/2016    Total Time spent with patient: 20 minutes  Subjective:   Jorge Baker is a 51 y.o. male patient admitted with declining overall health, inability to walk  .  HPI:   51 year old male, divorced, on disability ( due to vision loss as per chart notes,) who was living with his mother prior to admission.   He was admitted on 2/4 /18, due to declining health, generalized pain, and inability to walk.  Report is that patient was drinking heavily prior to admission , and was drinking half a gallon of whiskey per week.  Date of last alcohol consumption is unclear- patient was admitted on 2/4 at which time BAL was < 5.    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  Patient is a limited historian at this time. He denies feeling depressed, and states his mood is "OK". He does endorse low energy level , but attributes this to medical illness, pain rather than to depression. He denies any suicidal ideations or self injurious thoughts . He reports he is having difficulty with general discomfort and pain. He acknowledges he had been drinking daily, but  states he does not feel he has an alcohol problem.  I have discussed case with RN - report is that there has been partial improvement, but remains cognitively slowed, confused, presenting with a  constricted , depressed affect .    Ammonia has been elevated ( 81 on 2/10)  -  is currently on Lactulose/Xifaxan .   Past Psychiatric History: patient denies any prior psychiatric history, denies prior history of severe depression or of suicidal attempts or ideations. Denies prior history of psychiatric medication management .   Risk to Self: Is patient at risk for suicide?: No Risk to Others:   Prior Inpatient Therapy:   Prior Outpatient Therapy:    Past Medical History:  Past Medical History:  Diagnosis Date  . Diabetes mellitus type 1 with complications ([place]) 5038   diagnosed age 53. Insulin pump placed in 2013.    Marland Kitchen ETOH abuse    alcoholic hepatitis, 8828, 06/2016, 11/2016.   Marland Kitchen Leukopenia 08/2016  . Osteoporosis   . Peripheral neuropathy ([place]) 2015  . 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  s/p vitrectomy and photocoagulation.     Past Surgical History:  Procedure Laterality Date  . COLONOSCOPY WITH PROPOFOL N/A 11/15/2016   Procedure: COLONOSCOPY WITH PROPOFOL;  Surgeon: Milus Banister, MD;  Location: [facility];  Service: Endoscopy;  Laterality: N/A;  . ESOPHAGOGASTRODUODENOSCOPY (EGD) WITH PROPOFOL N/A 11/14/2016   Procedure: ESOPHAGOGASTRODUODENOSCOPY (EGD) WITH PROPOFOL;  Surgeon: Milus Banister, MD;  Location: [facility];  Service: Endoscopy;  Laterality: N/A;  . EYE SURGERY  2011   vitrectomy and pan retinal photocoagulation.  at [facility].    . ORIF RADIAL HEAD / NECK FRACTURE     Family History:  Family History  Problem Relation Age of Onset  . CVA Father    Family Psychiatric  History: non contributory  Social History:  History  Alcohol Use  . Yes    Comment: see HPI     History  Drug Use No    Social History    Social History  . Marital status: Divorced    Spouse name: N/A  . Number of children: N/A  . Years of education: N/A   Occupational History  . disabled    Social History Main Topics  . Smoking status: Current Every Day Smoker    Packs/day: 1.25    Years: 36.00    Types: Cigarettes  . Smokeless tobacco: Never Used  . Alcohol use Yes     Comment: see HPI  . Drug use: No  . Sexual activity: No   Other Topics Concern  . None   Social History Narrative  . None   Additional Social History:    Allergies:  No Known Allergies  Labs:  Results for orders placed or performed during the hospital encounter of 11/12/16 (from the past 48 hour(s))  Hemoglobin and hematocrit, blood     Status: Abnormal   Collection Time: 11/17/16  9:24 PM  Result Value Ref Range   Hemoglobin 11.0 (L) 13.0 - 17.0 g/dL    Comment: REPEATED TO VERIFY POST TRANSFUSION SPECIMEN    HCT 32.1 (L) 39.0 - 52.0 %  Glucose, capillary     Status: None   Collection Time: 11/17/16  9:38 PM  Result Value Ref Range   Glucose-Capillary 79 65 - 99 mg/dL  Glucose, capillary     Status: Abnormal   Collection Time: 11/17/16 11:49 PM  Result Value Ref Range   Glucose-Capillary 139 (H) 65 - 99 mg/dL  Magnesium     Status: None   Collection Time: 11/18/16  3:02 AM  Result Value Ref Range   Magnesium 2.2 1.7 - 2.4 mg/dL  Ammonia     Status: Abnormal   Collection Time: 11/18/16  3:02 AM  Result Value Ref Range   Ammonia 81 (H) 9 - 35 umol/L  Comprehensive metabolic panel     Status: Abnormal   Collection Time: 11/18/16  3:02 AM  Result Value Ref Range   Sodium 134 (L) 135 - 145 mmol/L   Potassium 4.0 3.5 - 5.1 mmol/L   Chloride 104 101 - 111 mmol/L   CO2 20 (L) 22 - 32 mmol/L   Glucose, Bld 116 (H) 65 - 99 mg/dL   BUN <5 (L) 6 - 20 mg/dL   Creatinine, Ser 0.53 (L) 0.61 - 1.24 mg/dL   Calcium 7.6 (L) 8.9 - 10.3 mg/dL   Total Protein 5.8 (L) 6.5 - 8.1 g/dL   Albumin 1.9 (L) 3.5 - 5.0 g/dL   AST 97 (H) 15 -  41 U/L   ALT 60 17 - 63 U/L   Alkaline Phosphatase 269 (H) 38 - 126 U/L   Total Bilirubin 2.9 (H) 0.3 - 1.2 mg/dL   GFR calc non Af Amer >60 >60 mL/min   GFR calc Af Amer >60 >60 mL/min    Comment: (NOTE) The eGFR has been calculated using the CKD EPI equation. This calculation has not been validated in all clinical situations. eGFR's persistently <60  mL/min signify possible Chronic Kidney Disease.    Anion gap 10 5 - 15  CBC with Differential/Platelet     Status: Abnormal   Collection Time: 11/18/16  3:02 AM  Result Value Ref Range   WBC 6.8 4.0 - 10.5 K/uL   RBC 3.87 (L) 4.22 - 5.81 MIL/uL   Hemoglobin 12.1 (L) 13.0 - 17.0 g/dL   HCT 34.1 (L) 39.0 - 52.0 %   MCV 88.1 78.0 - 100.0 fL   MCH 31.3 26.0 - 34.0 pg   MCHC 35.5 30.0 - 36.0 g/dL   RDW 17.0 (H) 11.5 - 15.5 %   Platelets 103 (L) 150 - 400 K/uL    Comment: REPEATED TO VERIFY SPECIMEN CHECKED FOR CLOTS PLATELET COUNT CONFIRMED BY SMEAR    Neutrophils Relative % 63 %   Lymphocytes Relative 19 %   Monocytes Relative 17 %   Eosinophils Relative 1 %   Basophils Relative 0 %   Neutro Abs 4.2 1.7 - 7.7 K/uL   Lymphs Abs 1.3 0.7 - 4.0 K/uL   Monocytes Absolute 1.2 (H) 0.1 - 1.0 K/uL   Eosinophils Absolute 0.1 0.0 - 0.7 K/uL   Basophils Absolute 0.0 0.0 - 0.1 K/uL   RBC Morphology POLYCHROMASIA PRESENT     Comment: TARGET CELLS   Smear Review LARGE PLATELETS PRESENT   Phosphorus     Status: Abnormal   Collection Time: 11/18/16  3:02 AM  Result Value Ref Range   Phosphorus 2.4 (L) 2.5 - 4.6 mg/dL  Glucose, capillary     Status: Abnormal   Collection Time: 11/18/16  8:00 AM  Result Value Ref Range   Glucose-Capillary 140 (H) 65 - 99 mg/dL  Glucose, capillary     Status: Abnormal   Collection Time: 11/18/16 11:58 AM  Result Value Ref Range   Glucose-Capillary 105 (H) 65 - 99 mg/dL  Glucose, capillary     Status: None   Collection Time: 11/18/16  3:57 PM  Result Value Ref Range   Glucose-Capillary 83 65 - 99 mg/dL   Glucose, capillary     Status: Abnormal   Collection Time: 11/18/16  9:41 PM  Result Value Ref Range   Glucose-Capillary 144 (H) 65 - 99 mg/dL  CBC     Status: Abnormal   Collection Time: 11/19/16  2:22 AM  Result Value Ref Range   WBC 7.7 4.0 - 10.5 K/uL   RBC 4.22 4.22 - 5.81 MIL/uL   Hemoglobin 13.1 13.0 - 17.0 g/dL   HCT 37.7 (L) 39.0 - 52.0 %   MCV 89.3 78.0 - 100.0 fL   MCH 31.0 26.0 - 34.0 pg   MCHC 34.7 30.0 - 36.0 g/dL   RDW 17.8 (H) 11.5 - 15.5 %   Platelets 110 (L) 150 - 400 K/uL    Comment: REPEATED TO VERIFY CONSISTENT WITH PREVIOUS RESULT   Basic metabolic panel     Status: Abnormal   Collection Time: 11/19/16  2:22 AM  Result Value Ref Range   Sodium 137 135 - 145 mmol/L   Potassium 4.2 3.5 - 5.1 mmol/L   Chloride 102 101 - 111 mmol/L   CO2 20 (L) 22 - 32 mmol/L   Glucose, Bld 212 (H) 65 - 99 mg/dL   BUN <5 (L) 6 - 20 mg/dL   Creatinine, Ser 0.67 0.61 - 1.24 mg/dL   Calcium 7.9 (L) 8.9 - 10.3 mg/dL   GFR calc non Af Amer >60 >60 mL/min   GFR calc Af Amer >  60 >60 mL/min    Comment: (NOTE) The eGFR has been calculated using the CKD EPI equation. This calculation has not been validated in all clinical situations. eGFR's persistently <60 mL/min signify possible Chronic Kidney Disease.    Anion gap 15 5 - 15  Magnesium     Status: None   Collection Time: 11/19/16  2:22 AM  Result Value Ref Range   Magnesium 1.8 1.7 - 2.4 mg/dL  Phosphorus     Status: None   Collection Time: 11/19/16  2:22 AM  Result Value Ref Range   Phosphorus 3.2 2.5 - 4.6 mg/dL  Glucose, capillary     Status: Abnormal   Collection Time: 11/19/16  8:16 AM  Result Value Ref Range   Glucose-Capillary 261 (H) 65 - 99 mg/dL  Glucose, capillary     Status: Abnormal   Collection Time: 11/19/16 12:04 PM  Result Value Ref Range   Glucose-Capillary 316 (H) 65 - 99 mg/dL  Glucose, capillary     Status: Abnormal   Collection Time: 11/19/16  5:19 PM  Result Value Ref Range    Glucose-Capillary 343 (H) 65 - 99 mg/dL    Current Facility-Administered Medications  Medication Dose Route Frequency Provider Last Rate Last Dose  . 0.9 %  sodium chloride infusion   Intravenous Once Irine Seal V, MD      . acetaminophen (TYLENOL) tablet 650 mg  650 mg Oral Q6H PRN Karmen Bongo, MD   650 mg at 11/12/16 2313   Or  . acetaminophen (TYLENOL) suppository 650 mg  650 mg Rectal Q6H PRN Karmen Bongo, MD      . folic acid (FOLVITE) tablet 1 mg  1 mg Oral Daily Karmen Bongo, MD   1 mg at 11/19/16 0955  . furosemide (LASIX) injection 40 mg  40 mg Intravenous Q12H Eugenie Filler, MD   40 mg at 11/19/16 0855  . gi cocktail (Maalox,Lidocaine,Donnatal)  30 mL Oral TID PRN Eugenie Filler, MD      . insulin aspart (novoLOG) injection 0-5 Units  0-5 Units Subcutaneous QHS Jessica U Vann, DO      . insulin aspart (novoLOG) injection 0-9 Units  0-9 Units Subcutaneous TID WC Geradine Girt, DO   7 Units at 11/19/16 1754  . [START ON 11/20/2016] lactulose (CHRONULAC) 10 GM/15ML solution 20 g  20 g Oral QODAY Eugenie Filler, MD      . LORazepam (ATIVAN) injection 0-4 mg  0-4 mg Intravenous Q12H Irine Seal V, MD      . magnesium oxide (MAG-OX) tablet 400 mg  400 mg Oral BID Eugenie Filler, MD   400 mg at 11/19/16 1258  . metoprolol tartrate (LOPRESSOR) tablet 12.5 mg  12.5 mg Oral BID Eugenie Filler, MD   12.5 mg at 11/19/16 0955  . multivitamin with minerals tablet 1 tablet  1 tablet Oral Daily Eugenie Filler, MD   1 tablet at 11/19/16 0955  . nicotine (NICODERM CQ - dosed in mg/24 hours) patch 21 mg  21 mg Transdermal Daily Eugenie Filler, MD   21 mg at 11/19/16 0955  . ondansetron (ZOFRAN) tablet 4 mg  4 mg Oral Q6H PRN Karmen Bongo, MD       Or  . ondansetron [facility]) injection 4 mg  4 mg Intravenous Q6H PRN Karmen Bongo, MD      . pantoprazole (PROTONIX) EC tablet 40 mg  40 mg Oral Q0600 Milus Banister, MD   40 mg  at 11/19/16 0631  . potassium & sodium  phosphates (PHOS-NAK) 280-160-250 MG packet 1 packet  1 packet Oral TID WC & HS Geradine Girt, DO   1 packet at 11/19/16 1759  . rifaximin (XIFAXAN) tablet 550 mg  550 mg Oral BID Eugenie Filler, MD   550 mg at 11/19/16 0955  . sodium chloride flush (NS) 0.9 % injection 3 mL  3 mL Intravenous Q12H Karmen Bongo, MD   3 mL at 11/19/16 1121  . thiamine (VITAMIN B-1) tablet 100 mg  100 mg Oral Daily Karmen Bongo, MD   100 mg at 11/19/16 0955  . thiamine (VITAMIN B-1) tablet 100 mg  100 mg Oral Daily Eugenie Filler, MD   100 mg at 11/18/16 0845    Musculoskeletal: Strength & Muscle Tone: within normal limits Gait & Station: gait not examined Patient leans: N/A  Psychiatric Specialty Exam: Physical Exam  ROS vision impairment , generalized pain  Blood pressure (!) 134/118, pulse (!) 103, temperature 98.9 F (37.2 C), temperature source Oral, resp. rate 18, height '5\' 4"'  (1.626 m), weight 60.1 kg (132 lb 8 oz), SpO2 100 %.Body mass index is 22.74 kg/m.  General Appearance: poorly groomed  Eye Contact:  Minimal  Speech:  Slow  Volume:  Decreased  Mood:  denies feeling depressed or sad  Affect:  blunted, flat   Thought Process:  Slowed  Orientation:  Other:  alert to person, Zacarias Pontes, and to February, does not know day of week, date or year  Thought Content:  denies hallucinations, no delusions expressed   Suicidal Thoughts:  No denies suicidal or self injurious ideations  Homicidal Thoughts:  No  Memory:  recent and remote fair  Judgement:  Impaired  Insight:  Lacking  Psychomotor Activity:  intermittently restless   Concentration:  Concentration: Fair and Attention Span: Fair  Recall:  [employer] of Knowledge:  Fair  Language:  Fair  Akathisia:  No  Handed:  Right  AIMS (if indicated):     Assets:  Desire for Improvement Resilience  ADL's:  Impaired  Cognition:  Impaired,  Moderate  Sleep:        Treatment Plan Summary:   Disposition: No evidence of imminent  risk to self or others at present.   Patient does not meet criteria for psychiatric inpatient admission.   Patient would likely  benefit from antidepressant medication, but is currently stating that he is not interested in starting this type of medication.   Would continue to assess mood , offer starting antidepressant medication.  If he agrees, would consider starting ZOLOFT 25 mgrs QDAY    Agree with ongoing treatment to address hepatic encephalopathy . Neita Garnet, MD 11/19/2016 7:57 PM

## 2016-11-19 NOTE — Progress Notes (Signed)
Skin assessment as follows: bilateral heels are reddish/purple,soft,mushy and blanchable,painful. Mepilex applied and heels elevated. Left foot is slightly more  edematous than right. Large,red external hemorrhoids noted. Area cleansed and Barrier cream applied. Scrotum is enlarged and reddened. Cleansed and Barrier cream applied. MSAD noted at sacral area. Cleansed and Barrier cream applied. Alopecia in patches on head with dry, flaky areas noted. Reddish/brown skin discoloration noted around neck. Dry,red, areas noted entire body,some with flaking associated.

## 2016-11-19 NOTE — Progress Notes (Signed)
Transferred to 6E16 via bed. Belongings sent with patient. See assessment. No family currently with patient.

## 2016-11-20 LAB — CBC
HEMATOCRIT: 30.5 % — AB (ref 39.0–52.0)
HEMOGLOBIN: 10.5 g/dL — AB (ref 13.0–17.0)
MCH: 31.3 pg (ref 26.0–34.0)
MCHC: 34.4 g/dL (ref 30.0–36.0)
MCV: 90.8 fL (ref 78.0–100.0)
Platelets: 122 10*3/uL — ABNORMAL LOW (ref 150–400)
RBC: 3.36 MIL/uL — ABNORMAL LOW (ref 4.22–5.81)
RDW: 18 % — AB (ref 11.5–15.5)
WBC: 7 10*3/uL (ref 4.0–10.5)

## 2016-11-20 LAB — BASIC METABOLIC PANEL
Anion gap: 10 (ref 5–15)
BUN: 18 mg/dL (ref 6–20)
CALCIUM: 7.8 mg/dL — AB (ref 8.9–10.3)
CHLORIDE: 102 mmol/L (ref 101–111)
CO2: 21 mmol/L — AB (ref 22–32)
CREATININE: 0.85 mg/dL (ref 0.61–1.24)
GFR calc Af Amer: >60 mL/min (ref >60)
GFR calc non Af Amer: >60 mL/min (ref >60)
Glucose, Bld: 316 mg/dL — ABNORMAL HIGH (ref 65–99)
Potassium: 4.2 mmol/L (ref 3.5–5.1)
Sodium: 133 mmol/L — ABNORMAL LOW (ref 135–145)

## 2016-11-20 LAB — GLUCOSE, CAPILLARY
Glucose-Capillary: 200 mg/dL — ABNORMAL HIGH (ref 65–99)
Glucose-Capillary: 212 mg/dL — ABNORMAL HIGH (ref 65–99)
Glucose-Capillary: 251 mg/dL — ABNORMAL HIGH (ref 65–99)
Glucose-Capillary: 334 mg/dL — ABNORMAL HIGH (ref 65–99)
Glucose-Capillary: 358 mg/dL — ABNORMAL HIGH (ref 65–99)

## 2016-11-20 LAB — PHOSPHORUS: Phosphorus: 2.9 mg/dL (ref 2.5–4.6)

## 2016-11-20 LAB — MAGNESIUM: Magnesium: 1.8 mg/dL (ref 1.7–2.4)

## 2016-11-20 MED ORDER — GUAIFENESIN ER 600 MG PO TB12
1200.0000 mg | ORAL_TABLET | Freq: Two times a day (BID) | ORAL | Status: DC
  Administered 2016-11-20 – 2016-11-21 (×2): 1200 mg via ORAL
  Filled 2016-11-20 (×5): qty 2

## 2016-11-20 MED ORDER — INSULIN GLARGINE 100 UNIT/ML ~~LOC~~ SOLN
12.0000 [IU] | Freq: Every day | SUBCUTANEOUS | Status: DC
Start: 2016-11-20 — End: 2016-11-21
  Administered 2016-11-20 – 2016-11-21 (×2): 12 [IU] via SUBCUTANEOUS
  Filled 2016-11-20 (×2): qty 0.12

## 2016-11-20 NOTE — Consult Note (Signed)
[facility] Consult   Reason for Consult:  Depression  Referring Physician:  Dr. Grandville Silos Patient Identification: Jorge Baker MRN:  [id number] Principal Diagnosis: Symptomatic anemia Diagnosis:   Patient Active Problem List   Diagnosis Date Noted  . Abdominal distension [R14.0]   . Scrotal swelling [N50.89] 11/17/2016  . Tachycardia [R00.0]   . Hypomagnesemia [E83.42] 11/16/2016  . Hypophosphatemia [E83.39] 11/16/2016  . Thrombocytopenia ([place]) [D69.6] 11/16/2016  . Generalized weakness [R53.1]   . Hemorrhoids [K64.9]   . Gastritis and gastroduodenitis [K29.70, K59.90]   . Symptomatic anemia [D64.9] 11/12/2016  . Upper GI bleeding [K92.2] 11/12/2016  . Diabetes mellitus, type 2 ([place]) [E11.9] 11/12/2016  . Alcohol abuse [F10.10] 11/12/2016  . Right knee pain [M25.561] 11/12/2016    Total Time spent with patient: 20 minutes  Subjective:   Jorge Baker is a 51 y.o. male patient admitted with declining overall health, inability to walk  .  HPI:   51 year old male, divorced, on disability ( due to vision loss as per chart notes,) who was living with his mother prior to admission. He was admitted on 2/4 /18, due to declining health, generalized pain, and inability to walk. Report is that patient was drinking heavily prior to admission , and was drinking half a gallon of whiskey per week. Date of last alcohol consumption is unclear- patient was admitted on 2/4 at which time BAL was < 5.  He has a history of DM type I , worsening vision, alcoholic heapatitis (6734, 2017) .  Patient is a limited historian at this time. He denies feeling depressed, and states his mood is "OK". He does endorse low energy level , but attributes this to medical illness, pain rather than to depression. He denies any suicidal ideations or self injurious thoughts . He reports he is having difficulty with general discomfort and pain. He acknowledges he had been drinking daily, but states he does  not feel he has an alcohol problem.  I have discussed case with RN - report is that there has been partial improvement, but remains cognitively slowed, confused, presenting with a  constricted , depressed affect .    Ammonia has been elevated ( 81 on 2/10)  -  is currently on Lactulose/Xifaxan .   Past Psychiatric History: patient denies any prior psychiatric history, denies prior history of severe depression or of suicidal attempts or ideations. Denies prior history of psychiatric medication management .   Risk to Self: Is patient at risk for suicide?: No Risk to Others:   Prior Inpatient Therapy:   Prior Outpatient Therapy:    Past Medical History:  Past Medical History:  Diagnosis Date  . Diabetes mellitus type 1 with complications ([place]) 1937   diagnosed age 89. Insulin pump placed in 2013.    Marland Kitchen ETOH abuse    alcoholic hepatitis, 9024, 06/2016, 11/2016.   Marland Kitchen Leukopenia 08/2016  . Osteoporosis   . Peripheral neuropathy ([place]) 2015  . 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  s/p vitrectomy and photocoagulation.     Past Surgical History:  Procedure Laterality Date  . COLONOSCOPY WITH PROPOFOL N/A 11/15/2016   Procedure: COLONOSCOPY WITH PROPOFOL;  Surgeon: Milus Banister, MD;  Location: [facility];  Service: Endoscopy;  Laterality: N/A;  . ESOPHAGOGASTRODUODENOSCOPY (EGD) WITH PROPOFOL N/A 11/14/2016   Procedure: ESOPHAGOGASTRODUODENOSCOPY (EGD) WITH PROPOFOL;  Surgeon: Milus Banister, MD;  Location: [facility];  Service: Endoscopy;  Laterality: N/A;  . EYE  SURGERY  2011   vitrectomy and pan retinal photocoagulation.  at [facility].    . ORIF RADIAL HEAD / NECK FRACTURE     Family History:  Family History  Problem Relation Age of Onset  . CVA Father    Family Psychiatric  History: non contributory  Social History:  History  Alcohol Use  . Yes    Comment: see HPI     History  Drug Use No    Social History   Social  History  . Marital status: Divorced    Spouse name: N/A  . Number of children: N/A  . Years of education: N/A   Occupational History  . disabled    Social History Main Topics  . Smoking status: Current Every Day Smoker    Packs/day: 1.25    Years: 36.00    Types: Cigarettes  . Smokeless tobacco: Never Used  . Alcohol use Yes     Comment: see HPI  . Drug use: No  . Sexual activity: No   Other Topics Concern  . None   Social History Narrative  . None   Additional Social History:    Allergies:  No Known Allergies  Labs:  Results for orders placed or performed during the hospital encounter of 11/12/16 (from the past 48 hour(s))  Glucose, capillary     Status: Abnormal   Collection Time: 11/18/16 11:58 AM  Result Value Ref Range   Glucose-Capillary 105 (H) 65 - 99 mg/dL  Glucose, capillary     Status: None   Collection Time: 11/18/16  3:57 PM  Result Value Ref Range   Glucose-Capillary 83 65 - 99 mg/dL  Glucose, capillary     Status: Abnormal   Collection Time: 11/18/16  9:41 PM  Result Value Ref Range   Glucose-Capillary 144 (H) 65 - 99 mg/dL  CBC     Status: Abnormal   Collection Time: 11/19/16  2:22 AM  Result Value Ref Range   WBC 7.7 4.0 - 10.5 K/uL   RBC 4.22 4.22 - 5.81 MIL/uL   Hemoglobin 13.1 13.0 - 17.0 g/dL   HCT 37.7 (L) 39.0 - 52.0 %   MCV 89.3 78.0 - 100.0 fL   MCH 31.0 26.0 - 34.0 pg   MCHC 34.7 30.0 - 36.0 g/dL   RDW 17.8 (H) 11.5 - 15.5 %   Platelets 110 (L) 150 - 400 K/uL    Comment: REPEATED TO VERIFY CONSISTENT WITH PREVIOUS RESULT   Basic metabolic panel     Status: Abnormal   Collection Time: 11/19/16  2:22 AM  Result Value Ref Range   Sodium 137 135 - 145 mmol/L   Potassium 4.2 3.5 - 5.1 mmol/L   Chloride 102 101 - 111 mmol/L   CO2 20 (L) 22 - 32 mmol/L   Glucose, Bld 212 (H) 65 - 99 mg/dL   BUN <5 (L) 6 - 20 mg/dL   Creatinine, Ser 0.67 0.61 - 1.24 mg/dL   Calcium 7.9 (L) 8.9 - 10.3 mg/dL   GFR calc non Af Amer >60 >60 mL/min    GFR calc Af Amer >60 >60 mL/min    Comment: (NOTE) The eGFR has been calculated using the CKD EPI equation. This calculation has not been validated in all clinical situations. eGFR's persistently <60 mL/min signify possible Chronic Kidney Disease.    Anion gap 15 5 - 15  Magnesium     Status: None   Collection Time: 11/19/16  2:22 AM  Result Value Ref Range  Magnesium 1.8 1.7 - 2.4 mg/dL  Phosphorus     Status: None   Collection Time: 11/19/16  2:22 AM  Result Value Ref Range   Phosphorus 3.2 2.5 - 4.6 mg/dL  Glucose, capillary     Status: Abnormal   Collection Time: 11/19/16  8:16 AM  Result Value Ref Range   Glucose-Capillary 261 (H) 65 - 99 mg/dL  Glucose, capillary     Status: Abnormal   Collection Time: 11/19/16 12:04 PM  Result Value Ref Range   Glucose-Capillary 316 (H) 65 - 99 mg/dL  Glucose, capillary     Status: Abnormal   Collection Time: 11/19/16  5:19 PM  Result Value Ref Range   Glucose-Capillary 343 (H) 65 - 99 mg/dL  Glucose, capillary     Status: Abnormal   Collection Time: 11/19/16  8:30 PM  Result Value Ref Range   Glucose-Capillary 419 (H) 65 - 99 mg/dL  Glucose, capillary     Status: Abnormal   Collection Time: 11/20/16 12:31 AM  Result Value Ref Range   Glucose-Capillary 251 (H) 65 - 99 mg/dL  CBC     Status: Abnormal   Collection Time: 11/20/16  5:29 AM  Result Value Ref Range   WBC 7.0 4.0 - 10.5 K/uL   RBC 3.36 (L) 4.22 - 5.81 MIL/uL   Hemoglobin 10.5 (L) 13.0 - 17.0 g/dL   HCT 30.5 (L) 39.0 - 52.0 %   MCV 90.8 78.0 - 100.0 fL   MCH 31.3 26.0 - 34.0 pg   MCHC 34.4 30.0 - 36.0 g/dL   RDW 18.0 (H) 11.5 - 15.5 %   Platelets 122 (L) 150 - 400 K/uL  Basic metabolic panel     Status: Abnormal   Collection Time: 11/20/16  5:29 AM  Result Value Ref Range   Sodium 133 (L) 135 - 145 mmol/L   Potassium 4.2 3.5 - 5.1 mmol/L   Chloride 102 101 - 111 mmol/L   CO2 21 (L) 22 - 32 mmol/L   Glucose, Bld 316 (H) 65 - 99 mg/dL   BUN 18 6 - 20 mg/dL    Creatinine, Ser 0.85 0.61 - 1.24 mg/dL   Calcium 7.8 (L) 8.9 - 10.3 mg/dL   GFR calc non Af Amer >60 >60 mL/min   GFR calc Af Amer >60 >60 mL/min    Comment: (NOTE) The eGFR has been calculated using the CKD EPI equation. This calculation has not been validated in all clinical situations. eGFR's persistently <60 mL/min signify possible Chronic Kidney Disease.    Anion gap 10 5 - 15  Magnesium     Status: None   Collection Time: 11/20/16  5:29 AM  Result Value Ref Range   Magnesium 1.8 1.7 - 2.4 mg/dL  Phosphorus     Status: None   Collection Time: 11/20/16  5:29 AM  Result Value Ref Range   Phosphorus 2.9 2.5 - 4.6 mg/dL  Glucose, capillary     Status: Abnormal   Collection Time: 11/20/16  7:57 AM  Result Value Ref Range   Glucose-Capillary 334 (H) 65 - 99 mg/dL    Current Facility-Administered Medications  Medication Dose Route Frequency Provider Last Rate Last Dose  . 0.9 %  sodium chloride infusion   Intravenous Once Irine Seal V, MD      . acetaminophen (TYLENOL) tablet 650 mg  650 mg Oral Q6H PRN Karmen Bongo, MD   650 mg at 11/12/16 2313   Or  . acetaminophen (TYLENOL) suppository 650 mg  650 mg Rectal Q6H PRN Karmen Bongo, MD      . folic acid (FOLVITE) tablet 1 mg  1 mg Oral Daily Karmen Bongo, MD   1 mg at 11/20/16 1038  . furosemide (LASIX) injection 40 mg  40 mg Intravenous Q12H Eugenie Filler, MD   40 mg at 11/20/16 0840  . gi cocktail (Maalox,Lidocaine,Donnatal)  30 mL Oral TID PRN Eugenie Filler, MD      . insulin aspart (novoLOG) injection 0-5 Units  0-5 Units Subcutaneous QHS Geradine Girt, DO   5 Units at 11/19/16 2130  . insulin aspart (novoLOG) injection 0-9 Units  0-9 Units Subcutaneous TID WC Geradine Girt, DO   7 Units at 11/20/16 0839  . insulin glargine (LANTUS) injection 12 Units  12 Units Subcutaneous Daily Irine Seal V, MD      . lactulose (CHRONULAC) 10 GM/15ML solution 20 g  20 g Oral QODAY Irine Seal V, MD      .  magnesium oxide (MAG-OX) tablet 400 mg  400 mg Oral BID Eugenie Filler, MD   400 mg at 11/20/16 1038  . metoprolol tartrate (LOPRESSOR) tablet 12.5 mg  12.5 mg Oral BID Eugenie Filler, MD   12.5 mg at 11/20/16 1038  . multivitamin with minerals tablet 1 tablet  1 tablet Oral Daily Eugenie Filler, MD   1 tablet at 11/20/16 1038  . nicotine (NICODERM CQ - dosed in mg/24 hours) patch 21 mg  21 mg Transdermal Daily Eugenie Filler, MD   21 mg at 11/20/16 1039  . ondansetron (ZOFRAN) tablet 4 mg  4 mg Oral Q6H PRN Karmen Bongo, MD       Or  . ondansetron [facility]) injection 4 mg  4 mg Intravenous Q6H PRN Karmen Bongo, MD      . pantoprazole (PROTONIX) EC tablet 40 mg  40 mg Oral Q0600 Milus Banister, MD   40 mg at 11/20/16 0606  . potassium & sodium phosphates (PHOS-NAK) 280-160-250 MG packet 1 packet  1 packet Oral TID WC & HS Geradine Girt, DO   1 packet at 11/20/16 0840  . rifaximin (XIFAXAN) tablet 550 mg  550 mg Oral BID Eugenie Filler, MD   550 mg at 11/20/16 1038  . sodium chloride flush (NS) 0.9 % injection 3 mL  3 mL Intravenous Q12H Karmen Bongo, MD   3 mL at 11/20/16 1039  . thiamine (VITAMIN B-1) tablet 100 mg  100 mg Oral Daily Karmen Bongo, MD   100 mg at 11/20/16 1038  . thiamine (VITAMIN B-1) tablet 100 mg  100 mg Oral Daily Eugenie Filler, MD   100 mg at 11/18/16 0845    Musculoskeletal: Strength & Muscle Tone: within normal limits Gait & Station: gait not examined Patient leans: N/A  Psychiatric Specialty Exam: Physical Exam  ROS vision impairment , generalized pain  Blood pressure 108/71, pulse 79, temperature 98.9 F (37.2 C), temperature source Oral, resp. rate 17, height '5\' 4"'  (1.626 m), weight 60.5 kg (133 lb 6.1 oz), SpO2 98 %.Body mass index is 22.89 kg/m.  General Appearance: poorly groomed  Eye Contact:  Minimal  Speech:  Slow  Volume:  Decreased  Mood:  denies feeling depressed or sad  Affect:  blunted, flat   Thought Process:  Slowed   Orientation:  Other:  alert to person, Zacarias Pontes, and to February, does not know day of week, date or year  Thought Content:  denies  hallucinations, no delusions expressed   Suicidal Thoughts:  No denies suicidal or self injurious ideations  Homicidal Thoughts:  No  Memory:  recent and remote fair  Judgement:  Impaired  Insight:  Lacking  Psychomotor Activity:  intermittently restless   Concentration:  Concentration: Fair and Attention Span: Fair  Recall:  [employer] of Knowledge:  Fair  Language:  Fair  Akathisia:  No  Handed:  Right  AIMS (if indicated):     Assets:  Desire for Improvement Resilience  ADL's:  Impaired  Cognition:  Impaired,  Moderate  Sleep:        Treatment Plan Summary:  Disposition: No evidence of imminent risk to self or others at present.   Patient does not meet criteria for psychiatric inpatient admission.   Patient would likely  benefit from antidepressant medication, but is currently stating that he is not interested in starting this type of medication.   Would continue to assess mood , offer starting antidepressant medication.  If he agrees, would consider starting ZOLOFT 25 mgrs QDAY    Agree with ongoing treatment to address hepatic encephalopathy .   Ambrose Finland, MD 11/20/2016 11:11 AM

## 2016-11-20 NOTE — [place] FL2 (Signed)
[place] MEDICAID FL2 LEVEL OF CARE SCREENING TOOL     IDENTIFICATION  Patient Name: Jorge ChimesJoseph Ricardo Birthdate: 12/13/1965 Sex: male Admission Date (Current Location): 11/12/2016  [facility] [place]   Facility and Address:  [facility], [address], [place], [place] [address]      Provider Number: [id number]  Attending Physician Name and Address:  Rodolph Bonganiel Thompson V, MD  Relative Name and Phone Number:  Bettye BoeckMarshall,Candace - Mother;  [phone] (home)     Current Level of Care: Hospital Recommended Level of Care: Skilled Nursing Facility Prior Approval Number:    Date Approved/Denied:   PASRR Number:  (Submitted for PASRR 11/20/16 - MUST EX#[id number])  Discharge Plan: SNF    Current Diagnoses: Patient Active Problem List   Diagnosis Date Noted  . Abdominal distension   . Scrotal swelling 11/17/2016  . Tachycardia   . Hypomagnesemia 11/16/2016  . Hypophosphatemia 11/16/2016  . Thrombocytopenia (HCC) 11/16/2016  . Generalized weakness   . Hemorrhoids   . Gastritis and gastroduodenitis   . Symptomatic anemia 11/12/2016  . Upper GI bleeding 11/12/2016  . Diabetes mellitus, type 2 (HCC) 11/12/2016  . Alcohol abuse 11/12/2016  . Right knee pain 11/12/2016    Orientation RESPIRATION BLADDER Height & Weight     Self, Time, Situation, Place  Normal Incontinent (External urinary catheter placed 11/20/16) Weight: 133 lb 6.1 oz (60.5 kg) Height:  5\' 4"  (162.6 cm)  BEHAVIORAL SYMPTOMS/MOOD NEUROLOGICAL BOWEL NUTRITION STATUS      Continent Diet (DYS 2)  AMBULATORY STATUS COMMUNICATION OF NEEDS Skin   Extensive Assist Verbally Other (Comment) (Moisture Associated Skin Damage to anus, scrotum, perineum-treated with barrier cream; Ecchymosis to abdomen and right/left arma nd hand; Eczema to chest, shoulder and leg; Rash to abdomen, back and buttocks)                       Personal Care Assistance Level of Assistance  Bathing,  Feeding, Dressing Bathing Assistance: Maximum assistance Feeding assistance: Limited assistance Dressing Assistance: Maximum assistance     Functional Limitations Info  Sight, Hearing, Speech Sight Info: Impaired Hearing Info: Adequate Speech Info: Adequate    SPECIAL CARE FACTORS FREQUENCY  PT (By licensed PT), OT (By licensed OT)     PT Frequency: Evaluated 2/9 and a minimum of 3X per week therapy recommended OT Frequency: Evaluated 2/9 and a minimum of 2X per week therapy recommended            Contractures Contractures Info: Not present    Additional Factors Info  Code Status, Allergies, Insulin Sliding Scale Code Status Info: Full Allergies Info: No known allergies   Insulin Sliding Scale Info: 0-5 Units daily at bedtime and 0-9 Units 3 times per day with meals.       Current Medications (11/20/2016):  This is the current hospital active medication list Current Facility-Administered Medications  Medication Dose Route Frequency Provider Last Rate Last Dose  . 0.9 %  sodium chloride infusion   Intravenous Once Ramiro Harvestaniel Thompson V, MD      . acetaminophen (TYLENOL) tablet 650 mg  650 mg Oral Q6H PRN Jonah BlueJennifer Yates, MD   650 mg at 11/12/16 2313   Or  . acetaminophen (TYLENOL) suppository 650 mg  650 mg Rectal Q6H PRN Jonah BlueJennifer Yates, MD      . folic acid (FOLVITE) tablet 1 mg  1 mg Oral Daily Jonah BlueJennifer Yates, MD   1 mg at 11/20/16 1038  .  furosemide (LASIX) injection 40 mg  40 mg Intravenous Q12H Rodolph Bong, MD   40 mg at 11/20/16 0840  . gi cocktail (Maalox,Lidocaine,Donnatal)  30 mL Oral TID PRN Rodolph Bong, MD      . guaiFENesin [facility]) 12 hr tablet 1,200 mg  1,200 mg Oral BID Rodolph Bong, MD   1,200 mg at 11/20/16 1426  . insulin aspart (novoLOG) injection 0-5 Units  0-5 Units Subcutaneous QHS Shep Art, DO   5 Units at 11/19/16 2130  . insulin aspart (novoLOG) injection 0-9 Units  0-9 Units Subcutaneous TID WC Nael Art, DO   9 Units at  11/20/16 1233  . insulin glargine (LANTUS) injection 12 Units  12 Units Subcutaneous Daily Rodolph Bong, MD   12 Units at 11/20/16 1232  . lactulose (CHRONULAC) 10 GM/15ML solution 20 g  20 g Oral QODAY Rodolph Bong, MD   20 g at 11/20/16 1244  . magnesium oxide (MAG-OX) tablet 400 mg  400 mg Oral BID Rodolph Bong, MD   400 mg at 11/20/16 1038  . metoprolol tartrate (LOPRESSOR) tablet 12.5 mg  12.5 mg Oral BID Rodolph Bong, MD   12.5 mg at 11/20/16 1038  . multivitamin with minerals tablet 1 tablet  1 tablet Oral Daily Rodolph Bong, MD   1 tablet at 11/20/16 1038  . nicotine (NICODERM CQ - dosed in mg/24 hours) patch 21 mg  21 mg Transdermal Daily Rodolph Bong, MD   21 mg at 11/20/16 1039  . ondansetron (ZOFRAN) tablet 4 mg  4 mg Oral Q6H PRN Jonah Blue, MD       Or  . ondansetron [facility]) injection 4 mg  4 mg Intravenous Q6H PRN Jonah Blue, MD      . pantoprazole (PROTONIX) EC tablet 40 mg  40 mg Oral Q0600 Rachael Fee, MD   40 mg at 11/20/16 0606  . potassium & sodium phosphates (PHOS-NAK) 280-160-250 MG packet 1 packet  1 packet Oral TID WC & HS Glenard Art, DO   1 packet at 11/20/16 1232  . rifaximin (XIFAXAN) tablet 550 mg  550 mg Oral BID Rodolph Bong, MD   550 mg at 11/20/16 1038  . sodium chloride flush (NS) 0.9 % injection 3 mL  3 mL Intravenous Q12H Jonah Blue, MD   3 mL at 11/20/16 1039  . thiamine (VITAMIN B-1) tablet 100 mg  100 mg Oral Daily Jonah Blue, MD   100 mg at 11/20/16 1038  . thiamine (VITAMIN B-1) tablet 100 mg  100 mg Oral Daily Rodolph Bong, MD   100 mg at 11/18/16 0845     Discharge Medications: Please see discharge summary for a list of discharge medications.  Relevant Imaging Results:  Relevant Lab Results:   Additional Information ss#[id number].  Cristobal Goldmann, LCSW

## 2016-11-20 NOTE — Care Management Important Message (Signed)
Important Message  Patient Details  Name: Jorge ChimesJoseph Ferrall MRN: [id number] Date of Birth: 09/19/1966   Medicare Important Message Given:  Yes    Ione [place] Stefan ChurchBratton 11/20/2016, 4:39 PM

## 2016-11-20 NOTE — Clinical Social Work Placement (Signed)
   CLINICAL SOCIAL WORK PLACEMENT  NOTE  Date:  11/20/2016  Patient Details  Name: Jorge Baker MRN: [id number] Date of Birth: 02/04/1966  Clinical Social Work is seeking post-discharge placement for this patient at the Skilled  Nursing Facility level of care (*CSW will initial, date and re-position this form in  chart as items are completed):  Yes   Patient/family provided with [facility] Clinical Social Work Department's list of facilities offering this level of care within the geographic area requested by the patient (or if unable, by the patient's family).  Yes   Patient/family informed of their freedom to choose among providers that offer the needed level of care, that participate in Medicare, Medicaid or managed care program needed by the patient, have an available bed and are willing to accept the patient.  Yes   Patient/family informed of [facility]'s ownership interest in [facility] and [facility], as well as of the fact that they are under no obligation to receive care at these facilities.  PASRR submitted to EDS on 11/20/16 (MUST OZ#[id number])     PASRR number received on       Existing PASRR number confirmed on       FL2 transmitted to all facilities in geographic area requested by pt/family on 11/20/16     FL2 transmitted to all facilities within larger geographic area on       Patient informed that his/her managed care company has contracts with or will negotiate with certain facilities, including the following:            Patient/family informed of bed offers received.  Patient chooses bed at       Physician recommends and patient chooses bed at      Patient to be transferred to   on  .  Patient to be transferred to facility by       Patient family notified on   of transfer.  Name of family member notified:        PHYSICIAN       Additional Comment:    _______________________________________________ Cristobal Goldmannrawford, Keyairra Kolinski Bradley,  LCSW 11/20/2016, 3:30 PM

## 2016-11-20 NOTE — Progress Notes (Addendum)
PROGRESS NOTE    Nelson ChimesJoseph Tsan  WJX:[id number] DOB: 08/11/1966 DOA: 11/12/2016 PCP: Lavell IslamLOWARD,DAVIS L, MD   Brief Narrative:  Inocente SallesJoseph Marshallis a 51 y.o.malewith medical history significant of DM with resultant visual impairment, osteoporosis and ETOH abuse presenting because he feels like "something is sucking the life out of me." Has been feeling bad for the last 4-5 days. Noticed lack of energy first. Then total loss of energy. Unable to stand because he is so weak. No SOB. Whole body hurts, mostly the last 3 days. Chronic cough, no change fgrom baseline. No fevers. No abdominal pain. Doesn't look at stools, has not noticed color change. Has had loose stools for over a year. +n/v for a week or more, denies blood in it. For pain, takes oxycodone if it is really bad; if not too bad he just drinks more alcohol. Drinks 1/4-1/2 cup of alcohol all day every day. Prefers bourbon whiskey.  In SDU for hypoglycemia.  Has had both EGD and colonoscopy.  Now in withdrawal so will remain in SDU.   Assessment & Plan:   Principal Problem:   Symptomatic anemia Active Problems:   Upper GI bleeding   Diabetes mellitus, type 2 (HCC)   Alcohol abuse   Right knee pain   Gastritis and gastroduodenitis   Hemorrhoids   Hypomagnesemia   Hypophosphatemia   Thrombocytopenia (HCC)   Generalized weakness   Scrotal swelling   Tachycardia   Abdominal distension  #1 symptomatic anemia ? Etiology. Patient status post upper endoscopy which showed gastritis. Colonoscopy with both external and internal hemorrhoids. Status post 1 unit packed red blood cells. Patient currently off octreotide drip. Patient noted to be tachycardic and hemoglobin at 7.3 on 11/17/2016. Patient transfused 2 more units of packed red blood cells on 11/17/2016 current hemoglobin at 10.5. Continue daily Protonix. Follow.  #2 alcoholic hepatitis/alcohol dependence with withdrawal Patient with a history of chronic alcohol  dependence. Patient also noted to have distended abdomen which was tight, however improving. Patient has failed rehabilitation in the past. Patient with some slight tremors and high risk for withdrawal including seizures and DTs. Continue Ativan withdrawal protocol with scheduled Ativan. Continue thiamine and folic acid. Changed lactulose to every other day due to significant multiple stools. Continue Xifaxan. Abdominal ultrasound with small abdominal ascites no evidence of acute cholecystitis, moderate amount of gallbladder sludge, no focal hepatic mass. Lasix 40mg  IV q12 x 2doses. Follow.  #3 hypokalemia/hypophosphatemia Repleted potassium. Continue to replete phosphate..  #4 diabetes mellitus type 1 Hemoglobin A1c was 6.4 on 11/12/2016. CBGs have ranged from 251 - 419. Resume home dose Lantus. Sliding scale insulin.  #5 probable GERD PPI. GI cocktail as needed.  #6 knee pain Plain films negative. Uric acid within normal limits. Pain management. Follow.  #7 increased ammonia level Patient alert to self and place. Clinical improvement. Patient with multiple stools on lactulose and as such will change lactulose to every other day. Started patient on xifaxin which was not ordered initially.   #8 sinus tachycardia Likely secondary to probable alcohol dependence/withdrawal versus symptomatic anemia. Improving after start of beta blocker. Continue the Ativan withdrawal protocol. Patient status post 2 units packed red blood cells. Patient less tachycardic. Continue low-dose beta blocker.  #9 thrombocytopenia Likely alcohol induced. No overt bleeding. Improving daily. Follow.  #10 scrotal swelling Likely secondary to dependent edema. Improving with diureses. Scrotal ultrasound with pronounced skin thickening/edema about the scrotum. Both testicles appear normal without focal mass or lesion. No evidence of testicular torsion or  orchitis. No evidence of epididymitis. Patient with a normal white  count. Patient afebrile.Lasix Iv.   #11 abdominal distention Abdominal ultrasound with small volume abdominal ascites, no cholelithiasis or sonographic evidence of acute cholecystitis, moderate amount of gallbladder sludge, no focal hepatic mass.   #12 ? Depression Per nursing patient's mother requesting psychiatric consultation for assessment for possible depression. Patient has been seen in consultation by psychiatry and Zoloft was recommended however at this time patient is refusing any antidepressant medications. Patient is not suicidal or homicidal. Outpatient follow-up if needed.    DVT prophylaxis: SCDs Code Status: Full Family Communication: Updated patient. No family present. Disposition Plan: skilled nursing facility, hopefully in the next 24-48 hours.   Consultants:   Gastroenterology: Dr. Christella Hartigan 11/13/2016  Psychiatry: Dr. Jama Flavors 11/19/2016  Procedures:   Upper endoscopy 11/14/2016--- Dr. Christella Hartigan  Colonoscopy 11/15/2016----Dr. Christella Hartigan  Chest x-ray 11/12/2016,   Plain films of the right knee 11/13/2016  1 unit packed red blood cells 11/13/2016  2 units packed red blood cells 11/17/2016  Abdominal ultrasound 11/17/2016  Scrotal ultrasound 11/17/2016  Antimicrobials:  IV Rocephin 11/12/2016>>>> 11/14/2016   Subjective: Patient denies any chest pain, no shortness of breath. Patient with generalized weakness. Patient denies being depressed. Patient still not interested in any antidepressants.  Objective: Vitals:   11/19/16 1623 11/19/16 2043 11/20/16 0431 11/20/16 1000  BP: (!) 134/118 (!) 106/57 122/69 108/71  Pulse: (!) 103 (!) 115 94 79  Resp: 18 18 17 17   Temp: 98.9 F (37.2 C) 99 F (37.2 C) 99.4 F (37.4 C) 98.9 F (37.2 C)  TempSrc: Oral Oral Oral Oral  SpO2: 100% 100% 98% 98%  Weight:  60.5 kg (133 lb 6.1 oz)    Height:        Intake/Output Summary (Last 24 hours) at 11/20/16 1327 Last data filed at 11/20/16 1000  Gross per 24 hour    Intake             1060 ml  Output              300 ml  Net              760 ml   Filed Weights   11/15/16 0330 11/19/16 0423 11/19/16 2043  Weight: 61.4 kg (135 lb 6.4 oz) 60.1 kg (132 lb 8 oz) 60.5 kg (133 lb 6.1 oz)    Examination:  General exam: Appears calm and comfortable.Slight tremors. Respiratory system: Clear to auscultation anterior lung fields. Respiratory effort normal. Cardiovascular system: Tachycardia. No JVD, murmurs, rubs, gallops or clicks. Trace-1+ lower extremity edema. Gastrointestinal system: Abdomen is less distended, less tight and nontender. No organomegaly or masses felt. Normal bowel sounds heard. Central nervous system: Alert and oriented. No focal neurological deficits. Extremities: Symmetric 5 x 5 power. Skin: Ecchymosis noted over bilateral upper extremities. Psychiatry: Judgement and insight appear fair. Mood & affect appropriate.  GU: Scrotal swelling slowly improving.    Data Reviewed: I have personally reviewed following labs and imaging studies  CBC:  Recent Labs Lab 11/16/16 0835  11/17/16 0335 11/17/16 2124 11/18/16 0302 11/19/16 0222 11/20/16 0529  WBC 5.0  --  5.2  --  6.8 7.7 7.0  NEUTROABS 3.3  --  3.5  --  4.2  --   --   HGB 7.3*  < > 7.3* 11.0* 12.1* 13.1 10.5*  HCT 21.5*  < > 21.8* 32.1* 34.1* 37.7* 30.5*  MCV 89.2  --  90.1  --  88.1 89.3 90.8  PLT 130*  --  140*  --  103* 110* 122*  < > = values in this interval not displayed. Basic Metabolic Panel:  Recent Labs Lab 11/16/16 0835 11/17/16 0335 11/18/16 0302 11/19/16 0222 11/20/16 0529  NA 134* 134* 134* 137 133*  K 3.1* 4.4 4.0 4.2 4.2  CL 103 104 104 102 102  CO2 21* 21* 20* 20* 21*  GLUCOSE 175* 174* 116* 212* 316*  BUN <5* 6 <5* <5* 18  CREATININE 0.55* 0.65 0.53* 0.67 0.85  CALCIUM 7.3* 7.8* 7.6* 7.9* 7.8*  MG 1.7 1.7 2.2 1.8 1.8  PHOS 1.6* 1.8* 2.4* 3.2 2.9   GFR: Estimated Creatinine Clearance: 88 mL/min (by C-G formula based on SCr of 0.85  mg/dL). Liver Function Tests:  Recent Labs Lab 11/14/16 0242 11/16/16 0835 11/18/16 0302  AST 209* 140* 97*  ALT 61 66* 60  ALKPHOS 233* 247* 269*  BILITOT 1.0 1.5* 2.9*  PROT 4.8* 5.0* 5.8*  ALBUMIN 1.8* 1.8* 1.9*   No results for input(s): LIPASE, AMYLASE in the last 168 hours.  Recent Labs Lab 11/16/16 0835 11/18/16 0302  AMMONIA 57* 81*   Coagulation Profile: No results for input(s): INR, PROTIME in the last 168 hours. Cardiac Enzymes: No results for input(s): CKTOTAL, CKMB, CKMBINDEX, TROPONINI in the last 168 hours. BNP (last 3 results) No results for input(s): PROBNP in the last 8760 hours. HbA1C: No results for input(s): HGBA1C in the last 72 hours. CBG:  Recent Labs Lab 11/19/16 1719 11/19/16 2030 11/20/16 0031 11/20/16 0757 11/20/16 1143  GLUCAP 343* 419* 251* 334* 358*   Lipid Profile: No results for input(s): CHOL, HDL, LDLCALC, TRIG, CHOLHDL, LDLDIRECT in the last 72 hours. Thyroid Function Tests: No results for input(s): TSH, T4TOTAL, FREET4, T3FREE, THYROIDAB in the last 72 hours. Anemia Panel: No results for input(s): VITAMINB12, FOLATE, FERRITIN, TIBC, IRON, RETICCTPCT in the last 72 hours. Sepsis Labs: No results for input(s): PROCALCITON, LATICACIDVEN in the last 168 hours.  Recent Results (from the past 240 hour(s))  Urine culture     Status: Abnormal   Collection Time: 11/12/16  2:55 PM  Result Value Ref Range Status   Specimen Description URINE, CLEAN CATCH  Final   Special Requests NONE  Final   Culture (A)  Final    <10,000 COLONIES/mL INSIGNIFICANT GROWTH Performed at [facility], [address] [place], [place] [address]    Report Status 11/14/2016 FINAL  Final  MRSA PCR Screening     Status: None   Collection Time: 11/14/16 12:24 AM  Result Value Ref Range Status   MRSA by PCR NEGATIVE NEGATIVE Final    Comment:        The GeneXpert MRSA Assay (FDA approved for NASAL specimens only), is one component of  a comprehensive MRSA colonization surveillance program. It is not intended to diagnose MRSA infection nor to guide or monitor treatment for MRSA infections.          Radiology Studies: No results found.      Scheduled Meds: . sodium chloride   Intravenous Once  . folic acid  1 mg Oral Daily  . furosemide  40 mg Intravenous Q12H  . insulin aspart  0-5 Units Subcutaneous QHS  . insulin aspart  0-9 Units Subcutaneous TID WC  . insulin glargine  12 Units Subcutaneous Daily  . lactulose  20 g Oral QODAY  . magnesium oxide  400 mg Oral BID  . metoprolol tartrate  12.5 mg Oral BID  .  multivitamin with minerals  1 tablet Oral Daily  . nicotine  21 mg Transdermal Daily  . pantoprazole  40 mg Oral Q0600  . potassium & sodium phosphates  1 packet Oral TID WC & HS  . rifaximin  550 mg Oral BID  . sodium chloride flush  3 mL Intravenous Q12H  . thiamine  100 mg Oral Daily  . thiamine  100 mg Oral Daily   Continuous Infusions:   LOS: 7 days    Time spent: 35 mins    Matsuko Kretz, MD [facility] Pager [phone]  If 7PM-7AM, please contact night-coverage [email or web address] Password [facility] 11/20/2016, 1:27 PM

## 2016-11-20 NOTE — Progress Notes (Signed)
Physical Therapy Treatment Patient Details Name: Jorge ChimesJoseph Baker MRN: [id number] DOB: 08/02/1966 Today's Date: 11/20/2016    History of Present Illness Jorge ChimesJoseph Baker is a 51 y.o. male with medical history significant of DM 1 with resultant visual impairment, osteoporosis and ETOH abuse (daily) presenting because he feels like "something is sucking the life out of me."/weakness. Found to have a Hgb in 7's on admission with upper and lower GI peformed without probable cause.    PT Comments    Pt is able to participate in exercises in bed and chair with no c/o of increased pain but c/o of fatigue throughout treatment. Pt very limited during session due to weakness and apathy to progress mobility, stating that he has no energy. Pt communicated "i'm not going to eat my dinner."  SPTA encouraged pt to eat something at every meal and to build up energy to participate more and be able to go home. Total A +2 required with standing from EOB even with using Stedy.  Pt is unsafe to go home at this time due to low level of function with all mobility in room. PT requesting SNF upon d/c to increase functional independence. Will continue to follow acutely during his stay.    Follow Up Recommendations  SNF      Equipment Recommendations  Other (comment) (TBA)    Recommendations for Other Services       Precautions / Restrictions Precautions Precautions: Fall Precaution Comments: scrotal area swollen, red, sore Restrictions Weight Bearing Restrictions: No    Mobility  Bed Mobility Overal bed mobility: Needs Assistance   Rolling: Mod assist;+2 for physical assistance Sidelying to sit: Max assist;+2 for physical assistance       General bed mobility comments: required encouragement and cuing for proper sequencing with mod A +2 with railing to roll but Max +2 A to get into sitting. Once at EOB pt displayed full body tremoring. He stated "I feel off balance and that I'm going to fall." Tremoring  ceased once pt was scooted to EOB for feet to touch floor.   Transfers Overall transfer level: Needs assistance Equipment used: Rolling walker (2 wheeled) (stedy) Transfers: Sit to/from Stand Sit to Stand: Total assist;+2 physical assistance         General transfer comment: Attempted sit to stand x2 with total A +2 to perform perianal care but unsuccessful due to patient's extreme weakness. Transitioned to using stedy for 3rd attempt but pt still requied bed to be raised + total A to transition safely into stedy for 4th attempt.  Ambulation/Gait             General Gait Details: unable due to pt weakness.    Stairs            Wheelchair Mobility    Modified Rankin (Stroke Patients Only)       Balance Overall balance assessment: Needs assistance Sitting-balance support: Bilateral upper extremity supported;Feet supported Sitting balance-Leahy Scale: Poor Sitting balance - Comments: Sat EOB with min A x5 min Postural control: Left lateral lean   Standing balance-Leahy Scale: Zero Standing balance comment: total A to stand; very brief                    Cognition Arousal/Alertness: Lethargic;Awake/alert Behavior During Therapy: Flat affect Overall Cognitive Status: Impaired/Different from baseline Area of Impairment: Orientation;Awareness;Problem solving;Safety/judgement Orientation Level: Disoriented to;Situation       Safety/Judgement: Decreased awareness of deficits   Problem Solving: Requires verbal cues;Decreased initiation;Slow processing  Exercises Total Joint Exercises Ankle Circles/Pumps: Both;10 reps;Supine Quad Sets: Both;10 reps;Supine Towel Squeeze: 10 reps;Supine Heel Slides: Both;10 reps;Supine Hip ABduction/ADduction: Both;10 reps;Supine Shoulder Exercises Shoulder Flexion: Self ROM;Both;10 reps;Seated Elbow Flexion: Both;10 reps;Seated Other Exercises Other Exercises: Seated B self ROM chest press x10     General  Comments        Pertinent Vitals/Pain Pain Assessment: Faces Faces Pain Scale: Hurts little more Pain Location: all over Pain Descriptors / Indicators: Discomfort;Tiring Pain Intervention(s): Monitored during session;Repositioned    Home Living                      Prior Function            PT Goals (current goals can now be found in the care plan section) Acute Rehab PT Goals Patient Stated Goal: wants to get out of hospital PT Goal Formulation: With patient Potential to Achieve Goals: Fair Progress towards PT goals: Progressing toward goals (very slow)    Frequency    Min 3X/week      PT Plan Current plan remains appropriate    Co-evaluation             End of Session Equipment Utilized During Treatment: Gait belt Activity Tolerance: Patient limited by lethargy;Patient limited by fatigue;Other (comment) (pt gives up easily even when encouraged) Patient left: with call bell/phone within reach;in chair;with nursing/sitter in room     Time: 4098-1191 PT Time Calculation (min) (ACUTE ONLY): 36 min  Charges:  $Therapeutic Activity: 23-37 mins                    G Codes:      [facility] 12/08/16, 4:20 PM Kerrin Mo, [place] Pager [phone]

## 2016-11-20 NOTE — Clinical Social Work Note (Signed)
Clinical Social Work Assessment  Patient Details  Name: Jorge Baker MRN: [id number] Date of Birth: 09/23/1966  Date of referral:  11/16/16               Reason for consult:  Facility Placement                Permission sought to share information with:  Family Supports PeNelson Chimesrmission granted to share information::  Yes, Verbal Permission Granted  Name::     Jorge LabsCandance Baker  Agency::     Relationship::  Mother  Contact Information:  [phone]  Housing/Transportation Living arrangements for the past 2 months:  Single Family Home Source of Information:  Patient, Other (Comment Required) (Patient's mother) Patient Interpreter Needed:  None Criminal Activity/Legal Involvement Pertinent to Current Situation/Hospitalization:  No - Comment as needed Significant Relationships:  Dependent Children, Adult Children, Parents Lives with:  Parents (Per patient, his parents live with him) Do you feel safe going back to the place where you live?  No (Patient and mother in agreement with ST rehab) Need for family participation in patient care:  Yes (Comment)  Care giving concerns: Patient reported that his parents live with him in [place]. Jorge Baker in agreement with ST rehab prior to returning home.  Social Worker assessment / plan:  CSW talked with patient at the bedside and his mother by phone (with patient's permissions) regarding discharge disposition and recommendation of ST rehab. Patient was lying in bed and was awake, alert and agreeable to talking with CSW regarding discharge disposition. Jorge Baker reported that his parents live with him and that he is divorced and has a daughter in high school and an adult son. Patient reported that he has been to an inpatient alcohol treatment facility and has not been to a skilled facility for ST rehab before.  CSW explained facility search process and [place]. Talked with patient's mother  by phone and she is very concerned about her son and had been calling facilities today near where they live regarding her son needing rehab. Facility search process also explained to mother and she was informed that her son was provided with a SNF list.  Employment status:  Disabled (Comment on whether or not currently receiving Disability) Insurance information:  Teacher, English as a foreign languageManaged Medicare ([employer] HMO) PT Recommendations:  Skilled Nursing Facility Information / Referral to community resources:  Skilled Nursing Facility (Patient provided [place]  Patient/Family's Response to care:  No concerns expressed by patient or mother regarding care during hospitalization.  Patient/Family's Understanding of and Emotional Response to Diagnosis, Current Treatment, and Prognosis:  Not discussed.  Emotional Assessment Appearance:  Appears older than stated age Attitude/Demeanor/Rapport:  Other (Appropriate) Affect (typically observed):  Appropriate Orientation:  Oriented to Self, Oriented to Place, Oriented to Situation Alcohol / Substance use:  Tobacco Use, Alcohol Use, Illicit Drugs (Patient smokes, drinks alcohol and does not use illicit drugs) Psych involvement (Current and /or in the community):  Yes (Comment) (Patient seen due to depression)  Discharge Needs  Concerns to be addressed:  Discharge Planning Concerns Readmission within the last 30 days:  No Current discharge risk:  None Barriers to Discharge:  English as a second language teachernsurance Authorization, Continued Medical Work up   [employer], [employer] CorporationVanessa Bradley, Jorge Baker 11/20/2016, 3:12 PM

## 2016-11-20 NOTE — Care Management Note (Signed)
Case Management Note  Patient Details  Name: Nelson ChimesJoseph Angevine MRN: [id number] Date of Birth: 12/06/1965  Subjective/Objective:                 Patient admitted with symptomatic anemia and gastritis with hospital stay complicated by DT's and tachycardia. Patient from home with parents has history of heavy ETOH abuse. Psych consult completed. Patient agreeable to DC to SNF as rec by PT.    Action/Plan:  CSW to facilitate SNF DC with patient and family.  Expected Discharge Date:                  Expected Discharge Plan:  Skilled Nursing Facility  In-House Referral:  Clinical Social Work  Discharge planning Services  CM Consult  Post Acute Care Choice:    Choice offered to:     DME Arranged:    DME Agency:     HH Arranged:    HH Agency:     Status of Service:  Completed, signed off  If discussed at [employer] Meetings, dates discussed:    Additional Comments:  Lawerance SabalDebbie Bonnell Placzek, RN 11/20/2016, 1:57 PM

## 2016-11-21 LAB — GLUCOSE, CAPILLARY
GLUCOSE-CAPILLARY: 248 mg/dL — AB (ref 65–99)
GLUCOSE-CAPILLARY: 283 mg/dL — AB (ref 65–99)
GLUCOSE-CAPILLARY: 345 mg/dL — AB (ref 65–99)
Glucose-Capillary: 177 mg/dL — ABNORMAL HIGH (ref 65–99)

## 2016-11-21 MED ORDER — INSULIN GLARGINE 100 UNIT/ML ~~LOC~~ SOLN
3.0000 [IU] | Freq: Once | SUBCUTANEOUS | Status: AC
  Administered 2016-11-21: 3 [IU] via SUBCUTANEOUS
  Filled 2016-11-21: qty 0.03

## 2016-11-21 MED ORDER — INSULIN GLARGINE 100 UNIT/ML ~~LOC~~ SOLN
15.0000 [IU] | Freq: Every day | SUBCUTANEOUS | Status: DC
  Administered 2016-11-22: 15 [IU] via SUBCUTANEOUS
  Filled 2016-11-21: qty 0.15

## 2016-11-21 NOTE — Progress Notes (Signed)
Occupational Therapy Treatment Patient Details Name: Jorge Baker MRN: [id number] DOB: 12/05/1965 Today's Date: 11/21/2016    History of present illness Jorge ChimesJoseph Arch is a 51 y.o. male with medical history significant of DM 1 with resultant visual impairment, osteoporosis and ETOH abuse (daily) presenting because he feels like "something is sucking the life out of me."/weakness. Found to have a Hgb in 7's on admission with upper and lower GI peformed without probable cause. Pt with symptomatic anemia, alcoholic hepatitis, hypokalemia, thrombocytopenia, abdominal distention.   OT comments  Pt requires maximum encouragement to participate. Pt is making progress as compared to initial evaluation. Able to complete simple grooming tasks at bed level and is able to maintain sitting EOB x 5 min with S. Max A with bed mobility. Recommend rehab at SNF. Will continue to follow acutely to address established goals and facilitate DC to SNF.   Follow Up Recommendations  SNF;Supervision/Assistance - 24 hour    Equipment Recommendations  Other (comment) (TBA next venue)    Recommendations for Other Services      Precautions / Restrictions Precautions Precautions: Fall       Mobility Bed Mobility Overal bed mobility: Needs Assistance Bed Mobility: Rolling;Sit to Supine;Supine to Sit Rolling: Min assist (heavy use of rails)   Supine to sit: Max assist Sit to supine: Mod assist   General bed mobility comments: Pt began moving to EOB then started to say "I can't" repeatedly. Max A to complete transition to EOb. Pt then attempted to "fall" backwards and reqruied mod A to safely return to supine position. Used trendelenburg position and pt pulling on rails with BUE and pushing with feet to slide up to [facility].   Transfers                      Balance     Sitting balance-Leahy Scale: Poor                             ADL Overall ADL's : Needs assistance/impaired      Grooming: Wash/dry face;Set up   Upper Body Bathing: Minimal assistance;Bed level                             General ADL Comments: increased ability to follow commands and participate in ADL as pt is willing to do so       Vision    wears glasses.  Visual deficits at baseline. Refer to eval                 Perception     Praxis      Cognition   Behavior During Therapy: Flat affect;Agitated Overall Cognitive Status: No family/caregiver present to determine baseline cognitive functioning          Following Commands: Follows one step commands consistently Safety/Judgement: Decreased awareness of safety;Decreased awareness of deficits   Problem Solving: Slow processing;Difficulty sequencing  Once sitting EOB, pt became agitated and started yelling "back,back,back" and pushing back against therapist's hand- pt reclined to supine to prevent injury.       Extremity/Trunk Assessment     BUE generalized weakness.          Exercises Shoulder Exercises Shoulder Flexion: AROM;Both;10 reps;Supine Elbow Flexion: AROM;Both;15 reps;Supine   Shoulder Instructions       General Comments  Pt states his daughter is in high school, but does not  know the name of the school and that his son is a Control and instrumentation engineer in [place]    Pertinent Vitals/ Pain       Pain Assessment: Faces Faces Pain Scale: Hurts little more Pain Location: all over Pain Descriptors / Indicators: Discomfort;Tiring;Grimacing;Guarding;Moaning Pain Intervention(s): Limited activity within patient's tolerance  Home Living                                          Prior Functioning/Environment              Frequency  Min 2X/week        Progress Toward Goals  OT Goals(current goals can now be found in the care plan section)  Progress towards OT goals: Progressing toward goals  Acute Rehab OT Goals Patient Stated Goal: wants to get out of hospital OT Goal  Formulation: With patient Time For Goal Achievement: 12/01/16 Potential to Achieve Goals: Good ADL Goals Pt Will Perform Grooming: with set-up;sitting Pt Will Transfer to Toilet: with mod assist;with +2 assist;squat pivot transfer;stand pivot transfer;bedside commode Pt/caregiver will Perform Home Exercise Program: Increased ROM;Increased strength;Both right and left upper extremity;With written HEP provided Additional ADL Goal #1: Pt will be min A rolling right and left to A with basic ADLs Additional ADL Goal #2: Pt will be able to come up to sit with MIn A in prep for basic  ADLs and transfers  Plan Discharge plan remains appropriate    Co-evaluation                 End of Session     Activity Tolerance Patient limited by fatigue (appears self limiting)   Patient Left in bed;with call bell/phone within reach;with bed alarm set   Nurse Communication Mobility status        Time: 4098-1191 OT Time Calculation (min): 16 min  Charges: OT General Charges $OT Visit: 1 Procedure OT Treatments $Self Care/Home Management : 8-22 mins  Theodus Ran,HILLARY 11/21/2016, 3:49 PM  [facility], OT/L  [phone] 11/21/2016

## 2016-11-21 NOTE — Progress Notes (Signed)
PROGRESS NOTE    Jorge Baker  ZOX:[id number] DOB: 1965/11/20 DOA: 11/12/2016 PCP: Lavell Islam, MD   Brief Narrative:  Jorge Baker a 51 y.o.malewith medical history significant of DM with resultant visual impairment, osteoporosis and ETOH abuse presenting because he feels like "something is sucking the life out of me." Has been feeling bad for the last 4-5 days. Noticed lack of energy first. Then total loss of energy. Unable to stand because he is so weak. No SOB. Whole body hurts, mostly the last 3 days. Chronic cough, no change fgrom baseline. No fevers. No abdominal pain. Doesn't look at stools, has not noticed color change. Has had loose stools for over a year. +n/v for a week or more, denies blood in it. For pain, takes oxycodone if it is really bad; if not too bad he just drinks more alcohol. Drinks 1/4-1/2 cup of alcohol all day every day. Prefers bourbon whiskey.  In SDU for hypoglycemia.  Has had both EGD and colonoscopy.   patient wasn't withdrawal and The Step down Unit and Maintained on the Ativan Withdrawal Protocol. Patient Improved and Subsequently Transferred to the Telemetry Floor.    Assessment & Plan:   Principal Problem:   Symptomatic anemia Active Problems:   Upper GI bleeding   Diabetes mellitus, type 2 (HCC)   Alcohol abuse   Right knee pain   Gastritis and gastroduodenitis   Hemorrhoids   Hypomagnesemia   Hypophosphatemia   Thrombocytopenia (HCC)   Generalized weakness   Scrotal swelling   Tachycardia   Abdominal distension  #1 symptomatic anemia ? Etiology. Patient status post upper endoscopy which showed gastritis. Colonoscopy with both external and internal hemorrhoids. Status post 1 unit packed red blood cells. Patient currently off octreotide drip. Patient noted to be tachycardic and hemoglobin at 7.3 on 11/17/2016. Patient transfused 2 more units of packed red blood cells on 11/17/2016 current hemoglobin at 10.5. Continue  daily Protonix. Follow.  #2 alcoholic hepatitis/alcohol dependence with withdrawal Patient with a history of chronic alcohol dependence. Patient also noted to have distended abdomen which was tight, however improving. Patient has failed rehabilitation in the past. Patient with some slight tremors and high risk for withdrawal including seizures and DTs. Continue Ativan withdrawal protocol with scheduled Ativan. Continue thiamine and folic acid. Changed lactulose to every other day due to significant multiple stools. Continue Xifaxan. Abdominal ultrasound with small abdominal ascites no evidence of acute cholecystitis, moderate amount of gallbladder sludge, no focal hepatic mass. Patient received Lasix 40mg  IV q12 x 4 doses. Follow.  #3 hypokalemia/hypophosphatemia Repleted potassium. Continue to replete phosphate. Repeat labs in the morning.  #4 diabetes mellitus type 1 Hemoglobin A1c was 6.4 on 11/12/2016. CBGs have ranged from 177 - 283. Increase Lantus to 15 units daily. Continue sliding scale insulin.  #5 probable GERD PPI. GI cocktail as needed.  #6 knee pain Plain films negative. Uric acid within normal limits. Pain management. Follow.  #7 increased ammonia level Patient alert to self and place. Clinical improvement. Patient with multiple stools on lactulose and as such changed lactulose to every other day. Continue xifaxin.   #8 sinus tachycardia Likely secondary to probable alcohol dependence/withdrawal versus symptomatic anemia. Improved after start of beta blocker. Continue the Ativan withdrawal protocol. Patient status post 2 units packed red blood cells. Continue low-dose beta blocker.  #9 thrombocytopenia Likely alcohol induced. No overt bleeding. Improving daily. Follow.  #10 scrotal swelling Likely secondary to dependent edema. Improving with diureses. Scrotal ultrasound with pronounced skin thickening/edema  about the scrotum. Both testicles appear normal without focal mass  or lesion. No evidence of testicular torsion or orchitis. No evidence of epididymitis. Patient with a normal white count. Patient afebrile. S/p Lasix Iv.   #11 abdominal distention Abdominal ultrasound with small volume abdominal ascites, no cholelithiasis or sonographic evidence of acute cholecystitis, moderate amount of gallbladder sludge, no focal hepatic mass.   #12 ? Depression Per nursing patient's mother requesting psychiatric consultation for assessment for possible depression. Patient has been seen in consultation by psychiatry and Zoloft was recommended however at this time patient is refusing any antidepressant medications. Patient is not suicidal or homicidal. Outpatient follow-up if needed.    DVT prophylaxis: SCDs Code Status: Full Family Communication: Updated patient. No family present. Disposition Plan: skilled nursing facility, hopefully tomorrow.    Consultants:   Gastroenterology: Dr. Christella Hartigan 11/13/2016  Psychiatry: Dr. Jama Flavors 11/19/2016  Procedures:   Upper endoscopy 11/14/2016--- Dr. Christella Hartigan  Colonoscopy 11/15/2016----Dr. Christella Hartigan  Chest x-ray 11/12/2016,   Plain films of the right knee 11/13/2016  1 unit packed red blood cells 11/13/2016  2 units packed red blood cells 11/17/2016  Abdominal ultrasound 11/17/2016  Scrotal ultrasound 11/17/2016  Antimicrobials:  IV Rocephin 11/12/2016>>>> 11/14/2016   Subjective: Patient denies any chest pain, no shortness of breath. Patient with generalized weakness. Patient denies being depressed. Patient still not interested in any antidepressants. No complaints.  Objective: Vitals:   11/21/16 0500 11/21/16 0548 11/21/16 0846 11/21/16 1200  BP:  132/74 123/75 123/75  Pulse:  93 100 100  Resp:  18 20   Temp:  100 F (37.8 C) 99 F (37.2 C)   TempSrc:   Oral   SpO2:  99% 97%   Weight: 55.3 kg (121 lb 14.6 oz)     Height:        Intake/Output Summary (Last 24 hours) at 11/21/16 1443 Last data filed at  11/21/16 1420  Gross per 24 hour  Intake              480 ml  Output              250 ml  Net              230 ml   Filed Weights   11/19/16 2043 11/20/16 2213 11/21/16 0500  Weight: 60.5 kg (133 lb 6.1 oz) 55.3 kg (122 lb) 55.3 kg (121 lb 14.6 oz)    Examination:  General exam: Appears calm and comfortable.Slight tremors. Respiratory system: Clear to auscultation anterior lung fields. Respiratory effort normal. Cardiovascular system: RRR. No JVD, murmurs, rubs, gallops or clicks. Trace- lower extremity edema. Gastrointestinal system: Abdomen is less distended, less tight and nontender. No organomegaly or masses felt. Normal bowel sounds heard. Central nervous system: Alert and oriented. No focal neurological deficits. Extremities: Symmetric 5 x 5 power. Skin: Ecchymosis noted over bilateral upper extremities. Psychiatry: Judgement and insight appear fair. Mood & affect appropriate.  GU: Scrotal swelling slowly improving.    Data Reviewed: I have personally reviewed following labs and imaging studies  CBC:  Recent Labs Lab 11/16/16 0835  11/17/16 0335 11/17/16 2124 11/18/16 0302 11/19/16 0222 11/20/16 0529  WBC 5.0  --  5.2  --  6.8 7.7 7.0  NEUTROABS 3.3  --  3.5  --  4.2  --   --   HGB 7.3*  < > 7.3* 11.0* 12.1* 13.1 10.5*  HCT 21.5*  < > 21.8* 32.1* 34.1* 37.7* 30.5*  MCV 89.2  --  90.1  --  88.1 89.3 90.8  PLT 130*  --  140*  --  103* 110* 122*  < > = values in this interval not displayed. Basic Metabolic Panel:  Recent Labs Lab 11/16/16 0835 11/17/16 0335 11/18/16 0302 11/19/16 0222 11/20/16 0529  NA 134* 134* 134* 137 133*  K 3.1* 4.4 4.0 4.2 4.2  CL 103 104 104 102 102  CO2 21* 21* 20* 20* 21*  GLUCOSE 175* 174* 116* 212* 316*  BUN <5* 6 <5* <5* 18  CREATININE 0.55* 0.65 0.53* 0.67 0.85  CALCIUM 7.3* 7.8* 7.6* 7.9* 7.8*  MG 1.7 1.7 2.2 1.8 1.8  PHOS 1.6* 1.8* 2.4* 3.2 2.9   GFR: Estimated Creatinine Clearance: 82.2 mL/min (by C-G formula based  on SCr of 0.85 mg/dL). Liver Function Tests:  Recent Labs Lab 11/16/16 0835 11/18/16 0302  AST 140* 97*  ALT 66* 60  ALKPHOS 247* 269*  BILITOT 1.5* 2.9*  PROT 5.0* 5.8*  ALBUMIN 1.8* 1.9*   No results for input(s): LIPASE, AMYLASE in the last 168 hours.  Recent Labs Lab 11/16/16 0835 11/18/16 0302  AMMONIA 57* 81*   Coagulation Profile: No results for input(s): INR, PROTIME in the last 168 hours. Cardiac Enzymes: No results for input(s): CKTOTAL, CKMB, CKMBINDEX, TROPONINI in the last 168 hours. BNP (last 3 results) No results for input(s): PROBNP in the last 8760 hours. HbA1C: No results for input(s): HGBA1C in the last 72 hours. CBG:  Recent Labs Lab 11/20/16 1143 11/20/16 1657 11/20/16 2325 11/21/16 0747 11/21/16 1141  GLUCAP 358* 200* 212* 177* 248*   Lipid Profile: No results for input(s): CHOL, HDL, LDLCALC, TRIG, CHOLHDL, LDLDIRECT in the last 72 hours. Thyroid Function Tests: No results for input(s): TSH, T4TOTAL, FREET4, T3FREE, THYROIDAB in the last 72 hours. Anemia Panel: No results for input(s): VITAMINB12, FOLATE, FERRITIN, TIBC, IRON, RETICCTPCT in the last 72 hours. Sepsis Labs: No results for input(s): PROCALCITON, LATICACIDVEN in the last 168 hours.  Recent Results (from the past 240 hour(s))  Urine culture     Status: Abnormal   Collection Time: 11/12/16  2:55 PM  Result Value Ref Range Status   Specimen Description URINE, CLEAN CATCH  Final   Special Requests NONE  Final   Culture (A)  Final    <10,000 COLONIES/mL INSIGNIFICANT GROWTH Performed at [facility], [address] [place], [place] [address]    Report Status 11/14/2016 FINAL  Final  MRSA PCR Screening     Status: None   Collection Time: 11/14/16 12:24 AM  Result Value Ref Range Status   MRSA by PCR NEGATIVE NEGATIVE Final    Comment:        The GeneXpert MRSA Assay (FDA approved for NASAL specimens only), is one component of a comprehensive MRSA  colonization surveillance program. It is not intended to diagnose MRSA infection nor to guide or monitor treatment for MRSA infections.          Radiology Studies: No results found.      Scheduled Meds: . folic acid  1 mg Oral Daily  . guaiFENesin  1,200 mg Oral BID  . insulin aspart  0-5 Units Subcutaneous QHS  . insulin aspart  0-9 Units Subcutaneous TID WC  . insulin glargine  12 Units Subcutaneous Daily  . lactulose  20 g Oral QODAY  . magnesium oxide  400 mg Oral BID  . metoprolol tartrate  12.5 mg Oral BID  . multivitamin with minerals  1 tablet Oral Daily  . nicotine  21 mg Transdermal Daily  . pantoprazole  40 mg Oral Q0600  . potassium & sodium phosphates  1 packet Oral TID WC & HS  . rifaximin  550 mg Oral BID  . sodium chloride flush  3 mL Intravenous Q12H  . thiamine  100 mg Oral Daily   Continuous Infusions:   LOS: 8 days    Time spent: 35 mins    THOMPSON,DANIEL, MD [facility] Pager [phone]  If 7PM-7AM, please contact night-coverage [email or web address] Password [facility] 11/21/2016, 2:43 PM

## 2016-11-21 NOTE — Clinical Social Work Note (Signed)
CSW provided patient's mother with facility responses after calling several facilities that she was interested in and that did not respond in HUB (showed as pending). At 3:59 pm, CSW was provided with facility choice - [facility]. Facility contacted and can accept patient once authorization received. CSW contacted [facility] and initiated authorization. Reference [id number], provided by Glendora ScoreElaina, [employer] customer service representative. CSW will f/u with [employer] on 2/14 regarding authorization.  Genelle BalVanessa Mycala Warshawsky, MSW, LCSW Licensed Clinical Social Worker Clinical Social Work Department [facility] [phone]

## 2016-11-22 LAB — BASIC METABOLIC PANEL
Anion gap: 9 (ref 5–15)
BUN: 14 mg/dL (ref 6–20)
CALCIUM: 7.9 mg/dL — AB (ref 8.9–10.3)
CO2: 22 mmol/L (ref 22–32)
CREATININE: 0.56 mg/dL — AB (ref 0.61–1.24)
Chloride: 102 mmol/L (ref 101–111)
GFR calc Af Amer: >60 mL/min (ref >60)
GLUCOSE: 118 mg/dL — AB (ref 65–99)
POTASSIUM: 4.2 mmol/L (ref 3.5–5.1)
SODIUM: 133 mmol/L — AB (ref 135–145)

## 2016-11-22 LAB — PHOSPHORUS: PHOSPHORUS: 2.8 mg/dL (ref 2.5–4.6)

## 2016-11-22 LAB — CBC
HEMATOCRIT: 32.5 % — AB (ref 39.0–52.0)
Hemoglobin: 10.8 g/dL — ABNORMAL LOW (ref 13.0–17.0)
MCH: 30.9 pg (ref 26.0–34.0)
MCHC: 33.2 g/dL (ref 30.0–36.0)
MCV: 92.9 fL (ref 78.0–100.0)
PLATELETS: 149 10*3/uL — AB (ref 150–400)
RBC: 3.5 MIL/uL — ABNORMAL LOW (ref 4.22–5.81)
RDW: 17.7 % — AB (ref 11.5–15.5)
WBC: 7.9 10*3/uL (ref 4.0–10.5)

## 2016-11-22 LAB — GLUCOSE, CAPILLARY
GLUCOSE-CAPILLARY: 170 mg/dL — AB (ref 65–99)
GLUCOSE-CAPILLARY: 147 mg/dL — AB (ref 65–99)
Glucose-Capillary: 276 mg/dL — ABNORMAL HIGH (ref 65–99)

## 2016-11-22 LAB — MAGNESIUM: MAGNESIUM: 1.5 mg/dL — AB (ref 1.7–2.4)

## 2016-11-22 MED ORDER — LACTULOSE 10 GM/15ML PO SOLN: 20.0000 g | ORAL | 0 refills | Status: DC

## 2016-11-22 MED ORDER — MAGNESIUM OXIDE 400 (241.3 MG) MG PO TABS: 400.0000 mg | ORAL_TABLET | Freq: Two times a day (BID) | ORAL | Status: DC

## 2016-11-22 MED ORDER — PANTOPRAZOLE SODIUM 40 MG PO TBEC: 40.0000 mg | DELAYED_RELEASE_TABLET | Freq: Every day | ORAL | Status: AC

## 2016-11-22 MED ORDER — MAGNESIUM SULFATE 2 GM/50ML IV SOLN
2.0000 g | Freq: Once | INTRAVENOUS | Status: AC
  Administered 2016-11-22: 2 g via INTRAVENOUS
  Filled 2016-11-22: qty 50

## 2016-11-22 MED ORDER — ONDANSETRON HCL 4 MG PO TABS: 4.0000 mg | ORAL_TABLET | Freq: Four times a day (QID) | ORAL | 0 refills | Status: DC | PRN

## 2016-11-22 MED ORDER — RIFAXIMIN 550 MG PO TABS: 550.0000 mg | ORAL_TABLET | Freq: Two times a day (BID) | ORAL | Status: DC

## 2016-11-22 MED ORDER — GI COCKTAIL ~~LOC~~: 30.0000 mL | Freq: Three times a day (TID) | ORAL | Status: DC | PRN

## 2016-11-22 MED ORDER — INSULIN ASPART 100 UNIT/ML ~~LOC~~ SOLN: 0.0000 [IU] | Freq: Three times a day (TID) | SUBCUTANEOUS | 0 refills | Status: AC

## 2016-11-22 MED ORDER — ADULT MULTIVITAMIN W/MINERALS CH: 1.0000 | ORAL_TABLET | Freq: Every day | ORAL | Status: DC

## 2016-11-22 MED ORDER — INSULIN GLARGINE 100 UNIT/ML ~~LOC~~ SOLN: 15.0000 [IU] | Freq: Every day | SUBCUTANEOUS | 0 refills | Status: AC

## 2016-11-22 MED ORDER — METOPROLOL TARTRATE 25 MG PO TABS: 12.5000 mg | ORAL_TABLET | Freq: Two times a day (BID) | ORAL | Status: DC

## 2016-11-22 MED ORDER — INSULIN ASPART 100 UNIT/ML ~~LOC~~ SOLN: 0.0000 [IU] | Freq: Every day | SUBCUTANEOUS | 0 refills | Status: DC

## 2016-11-22 NOTE — Progress Notes (Signed)
Inpatient Diabetes Program Recommendations  AACE/ADA: New Consensus Statement on Inpatient Glycemic Control (2015)  Target Ranges:  Prepandial:   less than 140 mg/dL      Peak postprandial:   less than 180 mg/dL (1-2 hours)      Critically ill patients:  140 - 180 mg/dL   Results for Jorge Baker, Jorge Baker (MRN [id number]) as of 11/22/2016 11:11  Ref. Range 11/21/2016 07:47 11/21/2016 11:41 11/21/2016 16:48 11/21/2016 21:02 11/22/2016 08:26  Glucose-Capillary Latest Ref Range: 65 - 99 mg/dL 478177 (H) 295248 (H) 621283 (H) 345 (H) 170 (H)   Review of Glycemic Control  Diabetes history: DM 2 Outpatient Diabetes medications: Lantus 12 units, Humalog 0-10 SSI Current orders for Inpatient glycemic control: Lantus 15 units, Novolog Sensitive + HS scale  Inpatient Diabetes Program Recommendations:   Postprandial glucose is elevated. Please consider Novolog 5 units TID meal coverage in addition to correction scale. Patient is taking at least 5 units per meal for hyperglycemia.  Thanks,  Christena DeemShannon Uchenna Rappaport RN, MSN, [facility] Inpatient Diabetes Coordinator Team Pager [phone] (8a-5p)

## 2016-11-22 NOTE — Clinical Social Work Placement (Addendum)
   CLINICAL SOCIAL WORK PLACEMENT  NOTE 11/22/16 - DISCHARGED TO [facility]  Date:  11/22/2016  Patient Details  Name: Jorge Baker MRN: [id number] Date of Birth: 02/14/1966  Clinical Social Work is seeking post-discharge placement for this patient at the Skilled  Nursing Facility level of care (*CSW will initial, date and re-position this form in  chart as items are completed):  Yes   Patient/family provided with [facility] Clinical Social Work Department's list of facilities offering this level of care within the geographic area requested by the patient (or if unable, by the patient's family).  Yes   Patient/family informed of their freedom to choose among providers that offer the needed level of care, that participate in Medicare, Medicaid or managed care program needed by the patient, have an available bed and are willing to accept the patient.  Yes   Patient/family informed of [facility]'s ownership interest in [facility] and [facility], as well as of the fact that they are under no obligation to receive care at these facilities.  PASRR submitted to EDS on 11/20/16 (MUST JX#[id number])     PASRR number received on  11/20/16 - [id number]     Existing PASRR number confirmed on       FL2 transmitted to all facilities in geographic area requested by pt/family on 11/20/16     FL2 transmitted to all facilities within larger geographic area on       Patient informed that his/her managed care company has contracts with or will negotiate with certain facilities, including the following:         11/21/16 - Patient/family informed of bed offers received.  [facility]/family [facility] bed [facility]  [facility]. Patient's mother assisted with facility selection.  Physician recommends and patient chooses bed at      [facility].  Patient to be transferred to facility by  ambulance     Patient family notified on  11/22/16 of  transfer.  Name of family member notified:   Mother, Waldron LabsCandance Trumbull by phone-[phone].     PHYSICIAN      Additional Comment: Received authorization from [facility] (Jody with [employer]). Berkley HarveyAuth #[id number] eff. 11/22/16. Rug Level - RVC and 1st review date 2/16. Patient authorized to receive 529 minutes of therapy per week..   _______________________________________________ Cristobal Goldmannrawford, Jenilee Franey Bradley, LCSW 11/22/2016, 2:05 PM

## 2016-11-22 NOTE — Discharge Summary (Signed)
Physician Discharge Summary  Jorge Baker ZOX:[id number] DOB: 1965-10-10 DOA: 11/12/2016  PCP: Lavell Islam, MD  Admit date: 11/12/2016 Discharge date: 11/22/2016  Admitted From:Home Disposition:SNF  Recommendations for Outpatient Follow-up:  1. Follow up with PCP in 1-2 weeks 2. Please obtain BMP/CBC/Magnesium in one week   Home Health:SNF Equipment/Devices:None Discharge Condition: Stable CODE STATUS: Full code Diet recommendation: Carb modified heart healthy diet  Brief/Interim Summary:50 y.o.malewith medical history significant of DM with resultant visual impairment, osteoporosis and ETOH abuse presented with not feeling well, nausea and vomiting. Patient was found to have hemoglobin of 7 on admission with positive Hemoccult. Patient was admitted for further evaluation.  #Symptomatic anemia: Exact etiology unknown. Patient is status post upper endoscopy which was consistent with gastritis. Colonoscopy showed both external and internal hemorrhoids. Patient received 3 unit of blood transfusion in the hospital. No further bleeding. Continue oral Protonix. Evaluated by Gi.  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 Continue on vitamin. Patient is now on lactulose and Xifaxan. Education provided to the patient. No withdrawal today.  #Type 1 diabetes with uncontrolled hyperglycemia: Education provided regarding low carb diet. Continue on insulin the dose has been urgency. Recommended to monitor blood sugar level and follow-up with PCP.  #Thrombocytopenia: Platelet count improved. Monitor labs outpatient.   Patient is clinically improved. No new event. Recommend outpatient follow-up with PCP and psychiatrist. Patient is medically stable to transfer his care to SNF.   Discharge Diagnoses:  Principal Problem:   Symptomatic anemia Active Problems:   Upper GI bleeding   Diabetes mellitus, type 2 (HCC)   Alcohol abuse    Right knee pain   Gastritis and gastroduodenitis   Hemorrhoids   Hypomagnesemia   Hypophosphatemia   Thrombocytopenia (HCC)   Generalized weakness   Scrotal swelling   Tachycardia   Abdominal distension    Discharge Instructions  Discharge Instructions    Call MD for:  difficulty breathing, headache or visual disturbances    Complete by:  As directed    Call MD for:  extreme fatigue    Complete by:  As directed    Call MD for:  hives    Complete by:  As directed    Call MD for:  persistant dizziness or light-headedness    Complete by:  As directed    Call MD for:  persistant nausea and vomiting    Complete by:  As directed    Call MD for:  severe uncontrolled pain    Complete by:  As directed    Call MD for:  temperature >100.4    Complete by:  As directed    Diet - low sodium heart healthy    Complete by:  As directed    Diet Carb Modified    Complete by:  As directed    Increase activity slowly    Complete by:  As directed      Allergies as of 11/22/2016   No Known Allergies     Medication List    STOP taking these medications   HUMALOG 100 UNIT/ML injection Generic drug:  insulin lispro   oxyCODONE-acetaminophen 5-325 MG tablet Commonly known as:  PERCOCET/ROXICET     TAKE these medications   gi cocktail Susp suspension Take 30 mLs by mouth 3 (three) times daily as needed for indigestion. Shake well.   insulin aspart 100 UNIT/ML injection Commonly known as:  novoLOG Inject 0-5 Units into the skin at bedtime.   insulin aspart 100  UNIT/ML injection Commonly known as:  novoLOG Inject 0-9 Units into the skin 3 (three) times daily with meals.   insulin glargine 100 UNIT/ML injection Commonly known as:  LANTUS Inject 0.15 mLs (15 Units total) into the skin at bedtime. What changed:  how much to take   lactulose 10 GM/15ML solution Commonly known as:  CHRONULAC Take 30 mLs (20 g total) by mouth every other day. Start taking on:  11/24/2016    magnesium oxide 400 (241.3 Mg) MG tablet Commonly known as:  MAG-OX Take 1 tablet (400 mg total) by mouth 2 (two) times daily.   metoprolol tartrate 25 MG tablet Commonly known as:  LOPRESSOR Take 0.5 tablets (12.5 mg total) by mouth 2 (two) times daily.   multivitamin with minerals Tabs tablet Take 1 tablet by mouth daily. Start taking on:  11/23/2016   ondansetron 4 MG tablet Commonly known as:  ZOFRAN Take 1 tablet (4 mg total) by mouth every 6 (six) hours as needed for nausea.   pantoprazole 40 MG tablet Commonly known as:  PROTONIX Take 1 tablet (40 mg total) by mouth daily at 6 (six) AM.   rifaximin 550 MG Tabs tablet Commonly known as:  XIFAXAN Take 1 tablet (550 mg total) by mouth 2 (two) times daily.      Follow-up Information    CLOWARD,DAVIS L, MD. Schedule an appointment as soon as possible for a visit in 1 week.   Specialty:  Internal Medicine Why:  Appointment Date:  Contact information: [address] [place] [address] [phone]          No Known Allergies  Consultations:  Gastroenterology: Dr. Christella Hartigan 11/13/2016  Psychiatry: Dr. Jama Flavors 11/19/2016  Procedures/Studies:  Upper endoscopy 11/14/2016--- Dr. Christella Hartigan  Colonoscopy 11/15/2016----Dr. Christella Hartigan  Chest x-ray 11/12/2016,   Plain films of the right knee 11/13/2016  1 unit packed red blood cells 11/13/2016  2 units packed red blood cells 11/17/2016  Abdominal ultrasound 11/17/2016  Scrotal ultrasound 11/17/2016  Subjective: Patient was seen and examined at bedside. Denied headache, dizziness, nausea, vomiting, chest pain or shortness of breath. Very eager to go to nursing home today.  Discharge Exam: Vitals:   11/22/16 0608 11/22/16 1030  BP: 128/80 116/71  Pulse: 100 (!) 104  Resp: 20 18  Temp: 98.7 F (37.1 C) 98.8 F (37.1 C)   Vitals:   11/21/16 1643 11/21/16 2124 11/22/16 0608 11/22/16 1030  BP:  121/75 128/80 116/71  Pulse: (!) 108 (!) 110 100 (!) 104  Resp:   (!) 21 20 18   Temp: 99.6 F (37.6 C) 99.2 F (37.3 C) 98.7 F (37.1 C) 98.8 F (37.1 C)  TempSrc: Oral Oral Oral Oral  SpO2: 97% 98% 95% 97%  Weight:  56.2 kg (123 lb 14.4 oz)    Height:        General: Pt is alert, awake, not in acute distress Cardiovascular: RRR, S1/S2 +, no rubs, no gallops Respiratory: CTA bilaterally, no wheezing, no rhonchi Abdominal: Soft, NT, ND, bowel sounds + Extremities: no lower extremity edema, no cyanosis. No upper extremities tremor Neurology: Alert, awake, following commands. No focal neurological deficit.   The results of significant diagnostics from this hospitalization (including imaging, microbiology, ancillary and laboratory) are listed below for reference.     Microbiology: Recent Results (from the past 240 hour(s))  Urine culture     Status: Abnormal   Collection Time: 11/12/16  2:55 PM  Result Value Ref Range Status   Specimen Description URINE, CLEAN CATCH  Final   Special Requests NONE  Final   Culture (A)  Final    <10,000 COLONIES/mL INSIGNIFICANT GROWTH Performed at [facility], [address] [place], [place] [address]    Report Status 11/14/2016 FINAL  Final  MRSA PCR Screening     Status: None   Collection Time: 11/14/16 12:24 AM  Result Value Ref Range Status   MRSA by PCR NEGATIVE NEGATIVE Final    Comment:        The GeneXpert MRSA Assay (FDA approved for NASAL specimens only), is one component of a comprehensive MRSA colonization surveillance program. It is not intended to diagnose MRSA infection nor to guide or monitor treatment for MRSA infections.      Labs: BNP (last 3 results)  Recent Labs  06/29/16 1240  BNP 66.4   Basic Metabolic Panel:  Recent Labs Lab 11/17/16 0335 11/18/16 0302 11/19/16 0222 11/20/16 0529 11/22/16 0502  NA 134* 134* 137 133* 133*  K 4.4 4.0 4.2 4.2 4.2  CL 104 104 102 102 102  CO2 21* 20* 20* 21* 22  GLUCOSE 174* 116* 212* 316* 118*  BUN 6 <5* <5* 18 14   CREATININE 0.65 0.53* 0.67 0.85 0.56*  CALCIUM 7.8* 7.6* 7.9* 7.8* 7.9*  MG 1.7 2.2 1.8 1.8 1.5*  PHOS 1.8* 2.4* 3.2 2.9 2.8   Liver Function Tests:  Recent Labs Lab 11/16/16 0835 11/18/16 0302  AST 140* 97*  ALT 66* 60  ALKPHOS 247* 269*  BILITOT 1.5* 2.9*  PROT 5.0* 5.8*  ALBUMIN 1.8* 1.9*   No results for input(s): LIPASE, AMYLASE in the last 168 hours.  Recent Labs Lab 11/16/16 0835 11/18/16 0302  AMMONIA 57* 81*   CBC:  Recent Labs Lab 11/16/16 0835  11/17/16 0335 11/17/16 2124 11/18/16 0302 11/19/16 0222 11/20/16 0529 11/22/16 0502  WBC 5.0  --  5.2  --  6.8 7.7 7.0 7.9  NEUTROABS 3.3  --  3.5  --  4.2  --   --   --   HGB 7.3*  < > 7.3* 11.0* 12.1* 13.1 10.5* 10.8*  HCT 21.5*  < > 21.8* 32.1* 34.1* 37.7* 30.5* 32.5*  MCV 89.2  --  90.1  --  88.1 89.3 90.8 92.9  PLT 130*  --  140*  --  103* 110* 122* 149*  < > = values in this interval not displayed. Cardiac Enzymes: No results for input(s): CKTOTAL, CKMB, CKMBINDEX, TROPONINI in the last 168 hours. BNP: Invalid input(s): POCBNP CBG:  Recent Labs Lab 11/21/16 1141 11/21/16 1648 11/21/16 2102 11/22/16 0826 11/22/16 1126  GLUCAP 248* 283* 345* 170* 276*   D-Dimer No results for input(s): DDIMER in the last 72 hours. Hgb A1c No results for input(s): HGBA1C in the last 72 hours. Lipid Profile No results for input(s): CHOL, HDL, LDLCALC, TRIG, CHOLHDL, LDLDIRECT in the last 72 hours. Thyroid function studies No results for input(s): TSH, T4TOTAL, T3FREE, THYROIDAB in the last 72 hours.  Invalid input(s): FREET3 Anemia work up No results for input(s): VITAMINB12, FOLATE, FERRITIN, TIBC, IRON, RETICCTPCT in the last 72 hours. Urinalysis    Component Value Date/Time   COLORURINE AMBER (A) 11/12/2016 1455   APPEARANCEUR CLEAR 11/12/2016 1455   LABSPEC 1.015 11/12/2016 1455   PHURINE 6.0 11/12/2016 1455   GLUCOSEU NEGATIVE 11/12/2016 1455   HGBUR TRACE (A) 11/12/2016 1455   BILIRUBINUR  NEGATIVE 11/12/2016 1455   KETONESUR 15 (A) 11/12/2016 1455   PROTEINUR NEGATIVE 11/12/2016 1455   NITRITE  NEGATIVE 11/12/2016 1455   LEUKOCYTESUR NEGATIVE 11/12/2016 1455   Sepsis Labs Invalid input(s): PROCALCITONIN,  WBC,  LACTICIDVEN Microbiology Recent Results (from the past 240 hour(s))  Urine culture     Status: Abnormal   Collection Time: 11/12/16  2:55 PM  Result Value Ref Range Status   Specimen Description URINE, CLEAN CATCH  Final   Special Requests NONE  Final   Culture (A)  Final    <10,000 COLONIES/mL INSIGNIFICANT GROWTH Performed at [facility], [address], [place], [place] [address]    Report Status 11/14/2016 FINAL  Final  MRSA PCR Screening     Status: None   Collection Time: 11/14/16 12:24 AM  Result Value Ref Range Status   MRSA by PCR NEGATIVE NEGATIVE Final    Comment:        The GeneXpert MRSA Assay (FDA approved for NASAL specimens only), is one component of a comprehensive MRSA colonization surveillance program. It is not intended to diagnose MRSA infection nor to guide or monitor treatment for MRSA infections.      Time coordinating discharge: 31 minutes  SIGNED:   Maxie Barb, MD  [facility] 11/22/2016, 1:34 PM  If 7PM-7AM, please contact night-coverage [email or web address] Password [email or web address]

## 2016-11-22 NOTE — Progress Notes (Signed)
Report called to [facility] . S/W Isha. Barbera Settersurner, Keeyon Privitera B RN

## 2016-11-22 NOTE — Progress Notes (Signed)
Physical Therapy Treatment Patient Details Name: Kari Kerth MRN: [id number] DOB: 1966-07-18 Today's Date: 11/22/2016    History of Present Illness Martavius Lusty is a 51 y.o. male with medical history significant of DM 1 with resultant visual impairment, osteoporosis and ETOH abuse (daily) presenting because he feels like "something is sucking the life out of me."/weakness. Found to have a Hgb in 7's on admission with upper and lower GI peformed without probable cause.    PT Comments    Pt slowly progressing with mobility during PT sessions. Supine to sitting with mod assist and stand pivot to chair with +2 max assist. PT continuing to recommend SNF for further rehabilitation following acute stay.   Follow Up Recommendations  SNF     Equipment Recommendations   (to be addressed at next venue)    Recommendations for Other Services       Precautions / Restrictions Precautions Precautions: Fall Restrictions Weight Bearing Restrictions: No    Mobility  Bed Mobility Overal bed mobility: Needs Assistance Bed Mobility: Supine to Sit     Supine to sit: Mod assist     General bed mobility comments: assist needed at trunk to come to sitting position, able to move LEs to EOB independently.   Transfers Overall transfer level: Needs assistance Equipment used: 2 person hand held assist Transfers: Sit to/from [employer] Sit to Stand: +2 physical assistance;Max assist Stand pivot transfers: +2 physical assistance;Max assist (pt moving feet, assist needed for standing. )       General transfer comment: Verbal and physical assist for pt to fully stand. Pt leaning posteriorly throughout transfer.   Ambulation/Gait                 Stairs            Wheelchair Mobility    Modified Rankin (Stroke Patients Only)       Balance Overall balance assessment: Needs assistance Sitting-balance support: Feet supported;No upper extremity  supported Sitting balance-Leahy Scale: Fair     Standing balance support: Bilateral upper extremity supported Standing balance-Leahy Scale: Poor Standing balance comment: requiring physical assist                    Cognition Arousal/Alertness: Awake/alert Behavior During Therapy: Flat affect Overall Cognitive Status: No family/caregiver present to determine baseline cognitive functioning         Following Commands: Follows one step commands consistently       General Comments: poor memory, unable to recall where he was born    Exercises      General Comments        Pertinent Vitals/Pain Pain Assessment: Faces Faces Pain Scale: Hurts a little bit Pain Location: abdomen Pain Descriptors / Indicators: Discomfort (ate too much) Pain Intervention(s): Limited activity within patient's tolerance    Home Living                      Prior Function            PT Goals (current goals can now be found in the care plan section) Acute Rehab PT Goals Patient Stated Goal: feel better PT Goal Formulation: With patient Time For Goal Achievement: 12/01/16 Potential to Achieve Goals: Fair Progress towards PT goals: Progressing toward goals    Frequency    Min 3X/week      PT Plan Current plan remains appropriate    Co-evaluation  End of Session Equipment Utilized During Treatment: Gait belt Activity Tolerance: Patient tolerated treatment well Patient left: in chair;with call bell/phone within reach;with chair alarm set     Time: [id number] PT Time Calculation (min) (ACUTE ONLY): 13 min  Charges:  $Therapeutic Activity: 8-22 mins                    G Codes:      Christiane HaBenjamin J. Deshanta Lady, PT, CSCS Pager [phone] Office [phone]  11/22/2016, 12:40 PM

## 2016-11-23 NOTE — Progress Notes (Signed)
  Progress Note   Date: 11/23/2016  Patient Name: Jorge ChimesJoseph Baker        MRN#: [id number]  Late entry for codinig clarification:  Patient is on lactulose for hyperammonemia.    Maui Ahart Jaynie CollinsPrasad Zacary Bauer, MD

## 2016-11-30 MED FILL — LANTUS SOLOSTAR 100 UNITS/M: 100 | 75 days supply | Qty: 15 | Fill #0

## 2016-11-30 MED FILL — NovoLOG 100 UNIT/ML SOLN: 100 | 36 days supply | Qty: 10 | Fill #1

## 2016-12-21 MED FILL — ACCU-CHEK GUIDE TEST STRIP: 50 days supply | Qty: 200 | Fill #1

## 2017-01-19 MED FILL — NovoLOG 100 UNIT/ML SOLN: 100 | 36 days supply | Qty: 10 | Fill #2

## 2017-01-25 MED FILL — ACCU-CHEK GUIDE MONITOR SYS: W/DEVICE | 1 days supply | Qty: 1 | Fill #0

## 2017-02-07 MED FILL — PANTOPRAZOLE SOD DR 40 MG T: 40 | 30 days supply | Qty: 30 | Fill #0

## 2017-02-07 MED FILL — MAGNESIUM OXIDE 400 MG TAB: 400 (240 MG | 120 days supply | Qty: 120 | Fill #0

## 2017-02-07 MED FILL — spIRONOLACTONE 100 MG TAB: 100 | 30 days supply | Qty: 30 | Fill #0

## 2017-02-07 MED FILL — METOPROLOL TARTRATE 25 MG T: 25 | 60 days supply | Qty: 60 | Fill #0

## 2017-02-07 MED FILL — FUROSEMIDE 40 MG TABLET: 40 | 30 days supply | Qty: 30 | Fill #0

## 2017-02-07 MED FILL — ESCITALOPRAM 10 MG TABLET: 10 | 30 days supply | Qty: 30 | Fill #0

## 2017-02-07 MED FILL — TRIAMCINOLONE 0.1% CREAM: 0.1 | 30 days supply | Qty: 30 | Fill #0

## 2017-02-12 MED FILL — NovoLOG 100 UNIT/ML SOLN: 100 | 86 days supply | Qty: 30 | Fill #0

## 2017-02-12 MED FILL — ACCU-CHEK GUIDE TEST STRIP: 50 days supply | Qty: 200 | Fill #2

## 2017-02-12 MED FILL — LANTUS SOLOSTAR 100 UNITS/M: 100 | 75 days supply | Qty: 15 | Fill #1

## 2017-03-09 NOTE — Addendum Note (Signed)
Addendum  created 03/09/17 1050 by Emunah Texidor, MD   Sign clinical note    

## 2017-03-10 NOTE — Addendum Note (Signed)
Addendum  created 03/10/17 0913 by Yazhini Mcaulay, MD   Sign clinical note    

## 2017-03-12 MED FILL — FUROSEMIDE 40 MG TABLET: 40 | 90 days supply | Qty: 90 | Fill #0

## 2017-03-12 MED FILL — PANTOPRAZOLE SOD DR 40 MG T: 40 | 90 days supply | Qty: 90 | Fill #0

## 2017-03-12 MED FILL — spIRONOLACTONE 100 MG TAB: 100 | 90 days supply | Qty: 90 | Fill #0

## 2017-03-12 MED FILL — ESCITALOPRAM 10 MG TABLET: 10 | 90 days supply | Qty: 90 | Fill #0

## 2017-03-16 MED FILL — CLOBETASOL 0.05% CREAM: 0.05 | 30 days supply | Qty: 60 | Fill #0

## 2017-03-26 MED FILL — ACCU-CHEK GUIDE TEST STRIP: 50 days supply | Qty: 200 | Fill #3

## 2017-04-30 MED FILL — METOPROLOL TARTRATE 25 MG T: 25 | 60 days supply | Qty: 60 | Fill #1

## 2017-06-01 MED FILL — PROMETHAZINE 25 MG TABLET: 25 | 5 days supply | Qty: 20 | Fill #0

## 2017-06-01 MED FILL — ONDANSETRON ODT 4 MG TABLET: 4 | 10 days supply | Qty: 20 | Fill #0

## 2017-06-14 MED FILL — ACCU-CHEK GUIDE TEST STRIP: 50 days supply | Qty: 200 | Fill #4

## 2017-06-20 MED FILL — LANTUS SOLOSTAR 100 UNITS/M: 100 | 75 days supply | Qty: 15 | Fill #2

## 2017-07-24 MED FILL — PREDNISOLONE AC 1% EYE DROP: 1 | 60 days supply | Qty: 10 | Fill #0

## 2017-07-30 MED FILL — ACCU-CHEK GUIDE TEST STRIP: 50 days supply | Qty: 200 | Fill #5

## 2017-08-07 MED FILL — PANTOPRAZOLE SOD DR 40 MG T: 40 | 90 days supply | Qty: 90 | Fill #1

## 2017-08-07 MED FILL — FUROSEMIDE 40 MG TAB: 40 | 90 days supply | Qty: 90 | Fill #1

## 2017-08-07 MED FILL — NovoLOG 100 UNIT/ML SOLN: 100 | 86 days supply | Qty: 30 | Fill #1

## 2017-08-07 MED FILL — SPIRONOLACTONE 100 MG TABS: 100 | 90 days supply | Qty: 90 | Fill #1

## 2017-08-07 MED FILL — ESCITALOPRAM 10 MG TABLET: 10 | 90 days supply | Qty: 90 | Fill #1

## 2017-09-12 MED FILL — ACCU-CHEK GUIDE TEST STRIP: 50 days supply | Qty: 200 | Fill #0

## 2017-09-24 MED FILL — LANTUS SOLOSTAR 100 UNITS/M: 100 | 75 days supply | Qty: 15 | Fill #3

## 2017-10-24 MED FILL — CIPROFLOXACIN HCL 500 MG TA: 500 | 30 days supply | Qty: 30 | Fill #0

## 2017-10-30 MED FILL — ACIDOPHILUS PROBIOTIC TAB: 0.5 | 60 days supply | Qty: 120 | Fill #0

## 2017-10-30 MED FILL — CLINDAMYCIN HCL 300 MG CAPS: 300 | 10 days supply | Qty: 30 | Fill #0

## 2017-11-06 MED FILL — ACCU-CHEK GUIDE TEST STRIP: 50 days supply | Qty: 200 | Fill #1

## 2017-11-06 MED FILL — NovoLOG 100 UNIT/ML SOLN: 100 | 86 days supply | Qty: 30 | Fill #2

## 2017-11-08 ENCOUNTER — Inpatient Hospital Stay (HOSPITAL_COMMUNITY)
Admission: EM | Admit: 2017-11-08 | Discharge: 2017-11-11 | DRG: 606 | Disposition: A | Discharge status: Home Health | Payer: Medicare HMO | Attending: Family Medicine | Admitting: Family Medicine

## 2017-11-08 ENCOUNTER — Other Ambulatory Visit: Payer: Self-pay

## 2017-11-08 ENCOUNTER — Emergency Department (HOSPITAL_COMMUNITY): Payer: Medicare HMO

## 2017-11-08 ENCOUNTER — Encounter (HOSPITAL_COMMUNITY): Payer: Self-pay | Admitting: Emergency Medicine

## 2017-11-08 DIAGNOSIS — E1042 Type 1 diabetes mellitus with diabetic polyneuropathy: Secondary | ICD-10-CM | POA: Diagnosis present

## 2017-11-08 DIAGNOSIS — T148XXA Other injury of unspecified body region, initial encounter: Secondary | ICD-10-CM

## 2017-11-08 DIAGNOSIS — I10 Essential (primary) hypertension: Secondary | ICD-10-CM

## 2017-11-08 DIAGNOSIS — L0390 Cellulitis, unspecified: Secondary | ICD-10-CM | POA: Insufficient documentation

## 2017-11-08 DIAGNOSIS — L22 Diaper dermatitis: Secondary | ICD-10-CM | POA: Diagnosis present

## 2017-11-08 DIAGNOSIS — H409 Unspecified glaucoma: Secondary | ICD-10-CM | POA: Diagnosis present

## 2017-11-08 DIAGNOSIS — E860 Dehydration: Secondary | ICD-10-CM

## 2017-11-08 DIAGNOSIS — Y907 Blood alcohol level of 200-239 mg/100 ml: Secondary | ICD-10-CM | POA: Diagnosis present

## 2017-11-08 DIAGNOSIS — E103599 Type 1 diabetes mellitus with proliferative diabetic retinopathy without macular edema, unspecified eye: Secondary | ICD-10-CM | POA: Diagnosis present

## 2017-11-08 DIAGNOSIS — R531 Weakness: Secondary | ICD-10-CM

## 2017-11-08 DIAGNOSIS — R000 Tachycardia, unspecified: Secondary | ICD-10-CM | POA: Diagnosis present

## 2017-11-08 DIAGNOSIS — F1020 Alcohol dependence, uncomplicated: Secondary | ICD-10-CM | POA: Diagnosis present

## 2017-11-08 DIAGNOSIS — F17210 Nicotine dependence, cigarettes, uncomplicated: Secondary | ICD-10-CM | POA: Diagnosis present

## 2017-11-08 DIAGNOSIS — D696 Thrombocytopenia, unspecified: Secondary | ICD-10-CM | POA: Diagnosis present

## 2017-11-08 DIAGNOSIS — K7030 Alcoholic cirrhosis of liver without ascites: Secondary | ICD-10-CM | POA: Diagnosis present

## 2017-11-08 DIAGNOSIS — B372 Candidiasis of skin and nail: Secondary | ICD-10-CM | POA: Diagnosis not present

## 2017-11-08 DIAGNOSIS — Z9641 Presence of insulin pump (external) (internal): Secondary | ICD-10-CM | POA: Diagnosis present

## 2017-11-08 DIAGNOSIS — L03317 Cellulitis of buttock: Secondary | ICD-10-CM

## 2017-11-08 DIAGNOSIS — E119 Type 2 diabetes mellitus without complications: Secondary | ICD-10-CM

## 2017-11-08 DIAGNOSIS — Z9114 Patient's other noncompliance with medication regimen: Secondary | ICD-10-CM

## 2017-11-08 DIAGNOSIS — F1010 Alcohol abuse, uncomplicated: Secondary | ICD-10-CM | POA: Diagnosis present

## 2017-11-08 DIAGNOSIS — Z794 Long term (current) use of insulin: Secondary | ICD-10-CM

## 2017-11-08 DIAGNOSIS — M810 Age-related osteoporosis without current pathological fracture: Secondary | ICD-10-CM | POA: Diagnosis present

## 2017-11-08 DIAGNOSIS — E1010 Type 1 diabetes mellitus with ketoacidosis without coma: Secondary | ICD-10-CM | POA: Diagnosis present

## 2017-11-08 HISTORY — DX: Weakness: R53.1

## 2017-11-08 LAB — URINALYSIS, ROUTINE W REFLEX MICROSCOPIC
Bilirubin Urine: NEGATIVE
GLUCOSE, UA: 50 mg/dL — AB
Ketones, ur: 5 mg/dL — AB
NITRITE: NEGATIVE
PH: 5 (ref 5.0–8.0)
Protein, ur: 30 mg/dL — AB
SPECIFIC GRAVITY, URINE: 1.021 (ref 1.005–1.030)

## 2017-11-08 LAB — BASIC METABOLIC PANEL
ANION GAP: 19 — AB (ref 5–15)
Anion gap: 13 (ref 5–15)
Anion gap: 11 (ref 5–15)
BUN: 8 mg/dL (ref 6–20)
BUN: 7 mg/dL (ref 6–20)
BUN: 6 mg/dL (ref 6–20)
CALCIUM: 8 mg/dL — AB (ref 8.9–10.3)
CALCIUM: 7.2 mg/dL — AB (ref 8.9–10.3)
CHLORIDE: 104 mmol/L (ref 101–111)
CHLORIDE: 105 mmol/L (ref 101–111)
CO2: 18 mmol/L — ABNORMAL LOW (ref 22–32)
CO2: 20 mmol/L — AB (ref 22–32)
CO2: 21 mmol/L — AB (ref 22–32)
CREATININE: 0.84 mg/dL (ref 0.61–1.24)
CREATININE: 0.69 mg/dL (ref 0.61–1.24)
Calcium: 7.1 mg/dL — ABNORMAL LOW (ref 8.9–10.3)
Chloride: 102 mmol/L (ref 101–111)
Creatinine, Ser: 0.81 mg/dL (ref 0.61–1.24)
GFR calc Af Amer: >60 mL/min (ref >60)
GFR calc Af Amer: >60 mL/min (ref >60)
GFR calc non Af Amer: >60 mL/min (ref >60)
GFR calc non Af Amer: >60 mL/min (ref >60)
Glucose, Bld: 276 mg/dL — ABNORMAL HIGH (ref 65–99)
Glucose, Bld: 262 mg/dL — ABNORMAL HIGH (ref 65–99)
Glucose, Bld: 108 mg/dL — ABNORMAL HIGH (ref 65–99)
POTASSIUM: 4.3 mmol/L (ref 3.5–5.1)
Potassium: 4 mmol/L (ref 3.5–5.1)
Potassium: 4 mmol/L (ref 3.5–5.1)
SODIUM: 139 mmol/L (ref 135–145)
Sodium: 137 mmol/L (ref 135–145)
Sodium: 137 mmol/L (ref 135–145)

## 2017-11-08 LAB — CBC
HEMATOCRIT: 30.9 % — AB (ref 39.0–52.0)
HEMATOCRIT: 24.8 % — AB (ref 39.0–52.0)
HEMOGLOBIN: 8.5 g/dL — AB (ref 13.0–17.0)
Hemoglobin: 10.4 g/dL — ABNORMAL LOW (ref 13.0–17.0)
MCH: 34.1 pg — ABNORMAL HIGH (ref 26.0–34.0)
MCH: 34.7 pg — ABNORMAL HIGH (ref 26.0–34.0)
MCHC: 33.7 g/dL (ref 30.0–36.0)
MCHC: 34.3 g/dL (ref 30.0–36.0)
MCV: 101.3 fL — ABNORMAL HIGH (ref 78.0–100.0)
MCV: 101.2 fL — ABNORMAL HIGH (ref 78.0–100.0)
Platelets: 136 10*3/uL — ABNORMAL LOW (ref 150–400)
Platelets: 110 10*3/uL — ABNORMAL LOW (ref 150–400)
RBC: 3.05 MIL/uL — ABNORMAL LOW (ref 4.22–5.81)
RBC: 2.45 MIL/uL — AB (ref 4.22–5.81)
RDW: 17 % — AB (ref 11.5–15.5)
RDW: 17.4 % — ABNORMAL HIGH (ref 11.5–15.5)
WBC: 9.7 10*3/uL (ref 4.0–10.5)
WBC: 7.4 10*3/uL (ref 4.0–10.5)

## 2017-11-08 LAB — I-STAT CG4 LACTIC ACID, ED
Lactic Acid, Venous: 5.47 mmol/L (ref 0.5–1.9)
Lactic Acid, Venous: 4.16 mmol/L (ref 0.5–1.9)

## 2017-11-08 LAB — CBG MONITORING, ED: Glucose-Capillary: 262 mg/dL — ABNORMAL HIGH (ref 65–99)

## 2017-11-08 LAB — GLUCOSE, CAPILLARY
GLUCOSE-CAPILLARY: 116 mg/dL — AB (ref 65–99)
GLUCOSE-CAPILLARY: 210 mg/dL — AB (ref 65–99)
GLUCOSE-CAPILLARY: 323 mg/dL — AB (ref 65–99)
Glucose-Capillary: 93 mg/dL (ref 65–99)
Glucose-Capillary: 41 mg/dL — CL (ref 65–99)
Glucose-Capillary: 92 mg/dL (ref 65–99)
Glucose-Capillary: 101 mg/dL — ABNORMAL HIGH (ref 65–99)

## 2017-11-08 LAB — RAPID URINE DRUG SCREEN, HOSP PERFORMED
AMPHETAMINES: NOT DETECTED
Barbiturates: NOT DETECTED
Benzodiazepines: NOT DETECTED
COCAINE: NOT DETECTED
OPIATES: POSITIVE — AB
Tetrahydrocannabinol: NOT DETECTED

## 2017-11-08 LAB — CREATININE, SERUM: CREATININE: 0.76 mg/dL (ref 0.61–1.24)

## 2017-11-08 LAB — LACTIC ACID, PLASMA
LACTIC ACID, VENOUS: 3.3 mmol/L — AB (ref 0.5–1.9)
LACTIC ACID, VENOUS: 2.3 mmol/L — AB (ref 0.5–1.9)
Lactic Acid, Venous: 1.1 mmol/L (ref 0.5–1.9)

## 2017-11-08 LAB — ETHANOL: Alcohol, Ethyl (B): 210 mg/dL — ABNORMAL HIGH (ref <10)

## 2017-11-08 MED ORDER — IOPAMIDOL (ISOVUE-300) INJECTION 61%
INTRAVENOUS | Status: AC
  Administered 2017-11-08: 75 mL
  Filled 2017-11-08: qty 75

## 2017-11-08 MED ORDER — PANTOPRAZOLE SODIUM 40 MG PO TBEC
40.0000 mg | DELAYED_RELEASE_TABLET | Freq: Every day | ORAL | Status: DC
  Administered 2017-11-09 – 2017-11-11 (×3): 40 mg via ORAL
  Filled 2017-11-08 (×3): qty 1

## 2017-11-08 MED ORDER — THIAMINE HCL 100 MG/ML IJ SOLN: 100.0000 mg | Freq: Every day | INTRAMUSCULAR | Status: DC

## 2017-11-08 MED ORDER — GERHARDT'S BUTT CREAM
TOPICAL_CREAM | Freq: Three times a day (TID) | CUTANEOUS | Status: DC
  Administered 2017-11-08: 22:00:00 via TOPICAL
  Administered 2017-11-09 (×2): 1 via TOPICAL
  Administered 2017-11-09 – 2017-11-11 (×5): via TOPICAL
  Filled 2017-11-08 (×3): qty 1

## 2017-11-08 MED ORDER — MORPHINE SULFATE (PF) 4 MG/ML IV SOLN
4.0000 mg | Freq: Once | INTRAVENOUS | Status: AC
  Administered 2017-11-08: 4 mg via INTRAVENOUS
  Filled 2017-11-08: qty 1

## 2017-11-08 MED ORDER — FLUCONAZOLE 100MG IVPB
100.0000 mg | INTRAVENOUS | Status: AC
  Administered 2017-11-08 – 2017-11-09 (×2): 100 mg via INTRAVENOUS
  Filled 2017-11-08 (×2): qty 50

## 2017-11-08 MED ORDER — INSULIN ASPART 100 UNIT/ML ~~LOC~~ SOLN: 0.0000 [IU] | Freq: Three times a day (TID) | SUBCUTANEOUS | Status: DC

## 2017-11-08 MED ORDER — SODIUM CHLORIDE 0.9 % IV BOLUS (SEPSIS)
1000.0000 mL | Freq: Once | INTRAVENOUS | Status: AC
  Administered 2017-11-08: 1000 mL via INTRAVENOUS

## 2017-11-08 MED ORDER — SODIUM CHLORIDE 0.9 % IV SOLN
INTRAVENOUS | Status: DC
  Administered 2017-11-08: 19:00:00 via INTRAVENOUS

## 2017-11-08 MED ORDER — LORAZEPAM 1 MG PO TABS: 1.0000 mg | ORAL_TABLET | Freq: Four times a day (QID) | ORAL | Status: AC | PRN

## 2017-11-08 MED ORDER — SODIUM CHLORIDE 0.9 % IV SOLN
INTRAVENOUS | Status: DC
  Administered 2017-11-08 – 2017-11-09 (×3): via INTRAVENOUS

## 2017-11-08 MED ORDER — ONDANSETRON HCL 4 MG/2ML IJ SOLN
4.0000 mg | Freq: Once | INTRAMUSCULAR | Status: AC
  Administered 2017-11-08: 4 mg via INTRAVENOUS
  Filled 2017-11-08: qty 2

## 2017-11-08 MED ORDER — MORPHINE SULFATE (PF) 4 MG/ML IV SOLN: 1.0000 mg | INTRAVENOUS | Status: DC | PRN

## 2017-11-08 MED ORDER — ADULT MULTIVITAMIN W/MINERALS CH
1.0000 | ORAL_TABLET | Freq: Every day | ORAL | Status: DC
  Administered 2017-11-08 – 2017-11-11 (×4): 1 via ORAL
  Filled 2017-11-08 (×4): qty 1

## 2017-11-08 MED ORDER — INSULIN REGULAR HUMAN 100 UNIT/ML IJ SOLN
INTRAMUSCULAR | Status: DC
  Administered 2017-11-08: 1.5 [IU]/h via INTRAVENOUS
  Filled 2017-11-08: qty 1

## 2017-11-08 MED ORDER — ENOXAPARIN SODIUM 40 MG/0.4ML ~~LOC~~ SOLN: 40.0000 mg | SUBCUTANEOUS | Status: DC

## 2017-11-08 MED ORDER — RISAQUAD PO CAPS
1.0000 | ORAL_CAPSULE | Freq: Every day | ORAL | Status: DC
  Administered 2017-11-09 – 2017-11-11 (×3): 1 via ORAL
  Filled 2017-11-08 (×3): qty 1

## 2017-11-08 MED ORDER — SPIRONOLACTONE 25 MG PO TABS
100.0000 mg | ORAL_TABLET | Freq: Every day | ORAL | Status: DC
  Administered 2017-11-08 – 2017-11-11 (×4): 100 mg via ORAL
  Filled 2017-11-08 (×2): qty 4
  Filled 2017-11-08: qty 1
  Filled 2017-11-08: qty 4

## 2017-11-08 MED ORDER — VANCOMYCIN HCL IN DEXTROSE 1-5 GM/200ML-% IV SOLN
1000.0000 mg | Freq: Once | INTRAVENOUS | Status: AC
  Administered 2017-11-08: 1000 mg via INTRAVENOUS
  Filled 2017-11-08: qty 200

## 2017-11-08 MED ORDER — VITAMIN B-1 100 MG PO TABS
100.0000 mg | ORAL_TABLET | Freq: Every day | ORAL | Status: DC
  Administered 2017-11-08 – 2017-11-11 (×4): 100 mg via ORAL
  Filled 2017-11-08 (×4): qty 1

## 2017-11-08 MED ORDER — DEXTROSE-NACL 5-0.45 % IV SOLN
INTRAVENOUS | Status: DC
  Administered 2017-11-08: 21:00:00 via INTRAVENOUS

## 2017-11-08 MED ORDER — FOLIC ACID 1 MG PO TABS
1.0000 mg | ORAL_TABLET | Freq: Every day | ORAL | Status: DC
  Administered 2017-11-08 – 2017-11-11 (×4): 1 mg via ORAL
  Filled 2017-11-08 (×4): qty 1

## 2017-11-08 MED ORDER — ACETAMINOPHEN 325 MG PO TABS: 650.0000 mg | ORAL_TABLET | Freq: Four times a day (QID) | ORAL | Status: DC | PRN

## 2017-11-08 MED ORDER — DEXTROSE 50 % IV SOLN
25.0000 mL | INTRAVENOUS | Status: DC | PRN
  Administered 2017-11-08 – 2017-11-10 (×2): 25 mL via INTRAVENOUS
  Filled 2017-11-08 (×2): qty 50

## 2017-11-08 MED ORDER — LORAZEPAM 2 MG/ML IJ SOLN
0.0000 mg | Freq: Four times a day (QID) | INTRAMUSCULAR | Status: AC
  Administered 2017-11-08: 2 mg via INTRAVENOUS
  Administered 2017-11-09 (×2): 1 mg via INTRAVENOUS
  Administered 2017-11-09 – 2017-11-10 (×4): 2 mg via INTRAVENOUS
  Filled 2017-11-08 (×6): qty 1

## 2017-11-08 MED ORDER — LORAZEPAM 2 MG/ML IJ SOLN
1.0000 mg | Freq: Four times a day (QID) | INTRAMUSCULAR | Status: AC | PRN
  Administered 2017-11-10: 1 mg via INTRAVENOUS
  Filled 2017-11-08 (×2): qty 1

## 2017-11-08 MED ORDER — ESCITALOPRAM OXALATE 10 MG PO TABS
10.0000 mg | ORAL_TABLET | Freq: Every day | ORAL | Status: DC
  Administered 2017-11-09 – 2017-11-11 (×3): 10 mg via ORAL
  Filled 2017-11-08 (×4): qty 1

## 2017-11-08 MED ORDER — VITAMIN B-1 100 MG PO TABS: 100.0000 mg | ORAL_TABLET | Freq: Every day | ORAL | Status: DC

## 2017-11-08 MED ORDER — INSULIN ASPART 100 UNIT/ML ~~LOC~~ SOLN
0.0000 [IU] | Freq: Three times a day (TID) | SUBCUTANEOUS | Status: DC
  Administered 2017-11-08: 11 [IU] via SUBCUTANEOUS

## 2017-11-08 MED ORDER — PIPERACILLIN-TAZOBACTAM 3.375 G IVPB 30 MIN
3.3750 g | Freq: Once | INTRAVENOUS | Status: AC
Start: 2017-11-08 — End: 2017-11-08
  Administered 2017-11-08: 3.375 g via INTRAVENOUS
  Filled 2017-11-08: qty 50

## 2017-11-08 MED ORDER — POTASSIUM CHLORIDE 10 MEQ/100ML IV SOLN
10.0000 meq | INTRAVENOUS | Status: AC
Start: 2017-11-08 — End: 2017-11-08
  Administered 2017-11-08 (×2): 10 meq via INTRAVENOUS
  Filled 2017-11-08 (×2): qty 100

## 2017-11-08 MED ORDER — PIPERACILLIN-TAZOBACTAM 3.375 G IVPB 30 MIN: 3.3750 g | Freq: Three times a day (TID) | INTRAVENOUS | Status: DC

## 2017-11-08 MED ORDER — LORAZEPAM 2 MG/ML IJ SOLN
0.0000 mg | Freq: Two times a day (BID) | INTRAMUSCULAR | Status: DC
  Administered 2017-11-10: 1 mg via INTRAVENOUS
  Administered 2017-11-11: 2 mg via INTRAVENOUS
  Filled 2017-11-08 (×2): qty 1

## 2017-11-08 MED ORDER — INSULIN GLARGINE 100 UNIT/ML ~~LOC~~ SOLN
20.0000 [IU] | Freq: Every day | SUBCUTANEOUS | Status: DC
  Filled 2017-11-08: qty 0.2

## 2017-11-08 NOTE — ED Notes (Signed)
Pt has been cleaned, barrier cream applied.

## 2017-11-08 NOTE — ED Notes (Signed)
Condom cath applied

## 2017-11-08 NOTE — Progress Notes (Signed)
Pt refusing to let IV team place second IV site for potassium and antibiotics MD notified.

## 2017-11-08 NOTE — ED Triage Notes (Signed)
BIB EMS from home, pt reports gen weakness since Monday. Pt states he has been so weak he has been sitting in urine and feces since Monday. Pt presents with pants saturated in urine and stool, pt has irritant dermatitis to buttocks and groin area. Pt is tachycardic HR 130s, CBG 271.

## 2017-11-08 NOTE — ED Provider Notes (Signed)
[facility] Provider Note   CSN: [id number] Arrival date & time: 11/08/17  0605     History   Chief Complaint Chief Complaint  Patient presents with  . Weakness    HPI Jorge Baker is a 52 y.o. male.  HPI Patient presents to the emergency department with weakness over the last 4 days.  The patient states that Monday he started having weakness and was unable to get up the patient was found in his own feces and urine.  Patient is having a lot of discomfort in the buttocks perineum and anterior thighs.  Patient states that nothing seems to make the condition better or worse patient states he did not take any medications prior to arrival for his symptoms the patient denies chest pain, shortness of breath, headache,blurred vision, neck pain, fever, cough, numbness, dizziness, anorexia, edema, abdominal pain, nausea, vomiting, diarrhea, rash, back pain, dysuria, hematemesis, bloody stool, near syncope, or syncope. Past Medical History:  Diagnosis Date  . 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 Insulin pump placed in 2013.    Marland Kitchen. ETOH abuse    alcoholic hepatitis, 2015, 06/2016, 11/2016.   Marland Kitchen. Leukopenia 08/2016  . Osteoporosis   . Peripheral neuropathy 2015  . 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  s/p vitrectomy and photocoagulation.     Patient Active Problem List   Diagnosis Date Noted  . Cellulitis 11/08/2017  . Abdominal distension   . Scrotal swelling 11/17/2016  . Tachycardia   . Hypomagnesemia 11/16/2016  . Hypophosphatemia 11/16/2016  . Thrombocytopenia (HCC) 11/16/2016  . Generalized weakness   . Hemorrhoids   . Gastritis and gastroduodenitis   . Symptomatic anemia 11/12/2016  . Upper GI bleeding 11/12/2016  . Diabetes mellitus, type 2 (HCC) 11/12/2016  . Alcohol abuse 11/12/2016  . Right knee pain 11/12/2016    Past Surgical History:    Procedure Laterality Date  . COLONOSCOPY WITH PROPOFOL N/A 11/15/2016   Procedure: COLONOSCOPY WITH PROPOFOL;  Surgeon: Rachael Feeaniel P Jacobs, MD;  [facility];  Service: Endoscopy;  Laterality: N/A;  . ESOPHAGOGASTRODUODENOSCOPY (EGD) WITH PROPOFOL N/A 11/14/2016   Procedure: ESOPHAGOGASTRODUODENOSCOPY (EGD) WITH PROPOFOL;  Surgeon: Rachael Feeaniel P Jacobs, MD;  [facility];  Service: Endoscopy;  Laterality: N/A;  . EYE SURGERY  2011   [facility]    . ORIF RADIAL HEAD / NECK FRACTURE         Home Medications    Prior to Admission medications   Medication Sig Start Date End Date Taking? Authorizing Provider  ciprofloxacin (CIPRO) 500 MG tablet Take 500 mg by mouth 2 (two) times daily.   Yes [provider]  clindamycin (CLEOCIN) 300 MG capsule Take 300 mg by mouth 3 (three) times daily.   Yes [provider]  escitalopram (LEXAPRO) 10 MG tablet Take 10 mg by mouth daily.   Yes [provider]  furosemide (LASIX) 40 MG tablet Take 40 mg by mouth daily.   Yes [provider]  insulin glargine (LANTUS) 100 UNIT/ML injection Inject 0.15 mLs (15 Units total) into the skin at bedtime. Patient taking differently: Inject 20 Units into the skin at bedtime.  11/22/16  Yes Maxie BarbBhandari, Dron Prasad, MD  Lactobacillus (ACIDOPHILUS PO) Take 1 capsule by mouth daily.   Yes [provider]  spironolactone (ALDACTONE) 100 MG tablet Take 100 mg by mouth daily.  Yes [provider]  Alum & Mag Hydroxide-Simeth (GI COCKTAIL) SUSP suspension Take 30 mLs by mouth 3 (three) times daily as needed for indigestion. Shake well. Patient not taking: Reported on 11/08/2017 11/22/16   Maxie Barb, MD  insulin aspart (NOVOLOG) 100 UNIT/ML injection Inject 0-5 Units into the skin at bedtime. Patient not taking: Reported on 11/08/2017 11/22/16   Maxie Barb, MD  insulin aspart (NOVOLOG) 100 UNIT/ML  injection Inject 0-9 Units into the skin 3 (three) times daily with meals. Patient taking differently: Inject 0-9 Units into the skin 3 (three) times daily with meals. Up to 35 units [place] daily 11/22/16   Maxie Barb, MD  lactulose [facility]) 10 GM/15ML solution Take 30 mLs (20 g total) by mouth every other day. Patient not taking: Reported on 11/08/2017 11/24/16   Maxie Barb, MD  magnesium oxide (MAG-OX) 400 (241.3 Mg) MG tablet Take 1 tablet (400 mg total) by mouth 2 (two) times daily. Patient not taking: Reported on 11/08/2017 11/22/16   Maxie Barb, MD  metoprolol tartrate (LOPRESSOR) 25 MG tablet Take 0.5 tablets (12.5 mg total) by mouth 2 (two) times daily. Patient not taking: Reported on 11/08/2017 11/22/16   Maxie Barb, MD  Multiple Vitamin (MULTIVITAMIN WITH MINERALS) TABS tablet Take 1 tablet by mouth daily. Patient not taking: Reported on 11/08/2017 11/23/16   Maxie Barb, MD  ondansetron (ZOFRAN) 4 MG tablet Take 1 tablet (4 mg total) by mouth every 6 (six) hours as needed for nausea. Patient not taking: Reported on 11/08/2017 11/22/16   Maxie Barb, MD  pantoprazole (PROTONIX) 40 MG tablet Take 1 tablet (40 mg total) by mouth daily at 6 (six) AM. 11/22/16   Maxie Barb, MD  prednisoLONE acetate (PRED FORTE) 1 % ophthalmic suspension Place 1 drop into the left eye 4 (four) times daily. 07/24/17   [provider]  rifaximin (XIFAXAN) 550 MG TABS tablet Take 1 tablet (550 mg total) by mouth 2 (two) times daily. Patient not taking: Reported on 11/08/2017 11/22/16   Maxie Barb, MD    Family History Family History  Problem Relation Age of Onset  . CVA Father     Social History Social History   Tobacco Use  . Smoking status: Current Every Day Smoker    Packs/day: 1.25    Years: 36.00    Pack years: 45.00    Types: Cigarettes  . Smokeless tobacco: Never Used  Substance Use Topics  . Alcohol use: Yes      Comment: see HPI  . Drug use: No     Allergies   Hornet venom   Review of Systems Review of Systems  All other systems negative except as documented in the HPI. All pertinent positives and negatives as reviewed in the HPI. Physical Exam Updated Vital Signs BP (!) 148/84 (BP Location: Right Arm)   Pulse (!) 125   Temp 98.6 F (37 C) (Rectal)   Resp 16   Ht 5\' 4"  (1.626 m)   Wt 56.7 kg (125 lb)   SpO2 95%   BMI 21.46 kg/m   Physical Exam  Constitutional: He is oriented to person, place, and time. He appears well-developed and well-nourished. No distress.  HENT:  Head: Normocephalic and atraumatic.  Mouth/Throat: Oropharynx is clear and moist.  Eyes: Pupils are equal, round, and reactive to light.  Neck: Normal range of motion. Neck supple.  Cardiovascular: Regular rhythm and normal heart sounds. Tachycardia present. Exam reveals  no gallop and no friction rub.  No murmur heard. Pulmonary/Chest: Effort normal and breath sounds normal. No respiratory distress. He has no wheezes.  Abdominal: Soft. Bowel sounds are normal. He exhibits no distension. There is no tenderness.  Genitourinary:        Musculoskeletal: He exhibits no edema.  Neurological: He is alert and oriented to person, place, and time. He exhibits normal muscle tone. Coordination normal.  Skin: Skin is warm and dry. Capillary refill takes less than 2 seconds. No rash noted. No erythema.  Psychiatric: He has a normal mood and affect. His behavior is normal.  Nursing note and vitals reviewed.    ED Treatments / Results  Labs (all labs ordered are listed, but only abnormal results are displayed) Labs Reviewed  BASIC METABOLIC PANEL - Abnormal; Notable for the following components:      Result Value   CO2 18 (*)    Glucose, Bld 276 (*)    Calcium 8.0 (*)    Anion gap 19 (*)    All other components within normal limits  CBC - Abnormal; Notable for the following components:   RBC 3.05 (*)     Hemoglobin 10.4 (*)    HCT 30.9 (*)    MCV 101.3 (*)    MCH 34.1 (*)    RDW 17.0 (*)    Platelets 136 (*)    All other components within normal limits  ETHANOL - Abnormal; Notable for the following components:   Alcohol, Ethyl (B) 210 (*)    All other components within normal limits  CBG MONITORING, ED - Abnormal; Notable for the following components:   Glucose-Capillary 262 (*)    All other components within normal limits  I-STAT CG4 LACTIC ACID, ED - Abnormal; Notable for the following components:   Lactic Acid, Venous 5.47 (*)    All other components within normal limits  I-STAT CG4 LACTIC ACID, ED - Abnormal; Notable for the following components:   Lactic Acid, Venous 4.16 (*)    All other components within normal limits  CULTURE, BLOOD (ROUTINE X 2)  CULTURE, BLOOD (ROUTINE X 2) W REFLEX TO ID PANEL  URINALYSIS, ROUTINE W REFLEX MICROSCOPIC  RAPID URINE DRUG SCREEN, HOSP PERFORMED    EKG  EKG Interpretation  Date/Time:  Thursday November 08 2017 06:30:56 EST Ventricular Rate:  129 PR Interval:    QRS Duration: 80 QT Interval:  312 QTC Calculation: 457 R Axis:   83 Text Interpretation:  Sinus tachycardia Anterior infarct, old Confirmed by Rochele Raring [phone]) on 11/08/2017 6:37:21 AM Also confirmed by Ward, Baxter Hire [phone]), editor Elita Quick ([id number])  on 11/08/2017 7:06:44 AM       Radiology Ct Pelvis W Contrast  Result Date: 11/08/2017 CLINICAL DATA:  Possible perianal abscess EXAM: CT PELVIS WITH CONTRAST TECHNIQUE: Multidetector CT imaging of the pelvis was performed using the standard protocol following the bolus administration of intravenous contrast. CONTRAST:  75 mL Isovue 300. COMPARISON:  None. FINDINGS: Urinary Tract: Bladder is well distended. No ureteral calculi are seen. Bowel: No obstructive or inflammatory changes of the bowel are seen. The appendix is within normal limits. Vascular/Lymphatic: Atherosclerotic calcifications are noted. No sizable  adenopathy is seen. Some scattered small inguinal lymph nodes are noted bilaterally. Reproductive:  Prostate is within normal limits. Other: No free pelvic fluid is noted. Fat containing inguinal hernias are noted bilaterally. No inflammatory changes are noted in the perirectal or perianal region to suggest abscess formation. Musculoskeletal: Degenerative changes of the lumbar  spine are seen. IMPRESSION: No findings to suggest perianal or perirectal abscess are seen. Electronically Signed   By: Alcide Clever M.D.   On: 11/08/2017 10:19    Procedures Procedures (including critical care time)  Medications Ordered in ED Medications  0.9 %  sodium chloride infusion ( Intravenous New Bag/Given 11/08/17 1121)  sodium chloride 0.9 % bolus 1,000 mL (0 mLs Intravenous Stopped 11/08/17 0850)  ondansetron (ZOFRAN) injection 4 mg (4 mg Intravenous Given 11/08/17 0726)  vancomycin (VANCOCIN) IVPB 1000 mg/200 mL premix (0 mg Intravenous Stopped 11/08/17 1115)  piperacillin-tazobactam (ZOSYN) IVPB 3.375 g (0 g Intravenous Stopped 11/08/17 0849)  morphine 4 MG/ML injection 4 mg (4 mg Intravenous Given 11/08/17 0802)  sodium chloride 0.9 % bolus 1,000 mL (0 mLs Intravenous Stopped 11/08/17 1122)  iopamidol (ISOVUE-300) 61 % injection (75 mLs  Contrast Given 11/08/17 0916)     Initial Impression / Assessment and Plan / ED Course  I have reviewed the triage vital signs and the nursing notes.  Pertinent labs & imaging results that were available during my care of the patient were reviewed by me and considered in my medical decision making (see chart for details).     Patient will need admission to the hospital for further evaluation of this skin issue and his tachycardia patient is advised of the plan and all questions were answered.  Patient was given antibiotics and there was concern for foreign years gangrene based on his tenderness and area of redness and discomfort.  Did start antibiotics as well. patient was  given IV fluids.  Final Clinical Impressions(s) / ED Diagnoses   Final diagnoses:  None    ED Discharge Orders    None       Charlestine Night, PA-C 11/08/17 1555    Deckard Stuber, Cristal Deer, PA-C 11/08/17 1555    Azalia Bilis, MD 11/09/17 [phone]

## 2017-11-08 NOTE — Progress Notes (Signed)
Pt refusing second IV site for antibiotics and potassium dr Ophelia Charteryates is aware and ordered to hold off on potassium right now and antibiotics and start insulin drip

## 2017-11-08 NOTE — ED Notes (Signed)
Patient urinated in bed after condom cath "fell off". Patient stated it was bothering him. Patient educated concerning using the call button when he has to use the bathroom. Patient cleaned, sheets changed.

## 2017-11-08 NOTE — H&P (Signed)
History and Physical    Jorge Baker ZOX:[id number] DOB: 1966-06-24 DOA: 11/08/2017  PCP: Lavell Islam, MD Consultants:   Patient coming from: home - lives with parents.   Chief Complaint: weakness  HPI: Jorge Baker is a 52 y.o. male with medical history significant of substance abuse of alcohol and tobacco, alcoholic liver cirrhosis, DM with history of DKA, HTN, porphyria cutaneous tarda, thrombocytopenia. He was recently discharged from the [facility] where he was admitted with acute blood loss anemia associated with GI bleeding. Patient presented to the ED with c/o severe progressive weakness since yesterday to the point that he was unable to get to the bathroom and was found by EMS sitting in his urine and feces. Patient c/o progressive weakness since yesterday, productive cough, pain in the groin, frequent urination.  ED Course: upon arrival to the ED patient   Review of Systems: As per HPI; otherwise review of systems reviewed and negative.   Ambulatory Status:  Ambulates without assistance  Past Medical History:  Diagnosis Date  . 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 Insulin pump placed in 2013.    Marland Kitchen ETOH abuse    alcoholic hepatitis, 2015, 06/2016, 11/2016.   Marland Kitchen Leukopenia 08/2016  . Osteoporosis   . Peripheral neuropathy 2015  . 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  s/p vitrectomy and photocoagulation.     Past Surgical History:  Procedure Laterality Date  . COLONOSCOPY WITH PROPOFOL N/A 11/15/2016   Procedure: COLONOSCOPY WITH PROPOFOL;  Surgeon: Rachael Fee, MD;  Location: [facility] ENDOSCOPY;  Service: Endoscopy;  Laterality: N/A;  . ESOPHAGOGASTRODUODENOSCOPY (EGD) WITH PROPOFOL N/A 11/14/2016   Procedure: ESOPHAGOGASTRODUODENOSCOPY (EGD) WITH PROPOFOL;  Surgeon: Rachael Fee, MD;  Location[facility] ENDOSCOPY;  Service: Endoscopy;  Laterality: N/A;  . EYE SURGERY  2011     vitrectomy and pan retinal photocoagulation.  at [facility].    . ORIF RADIAL HEAD / NECK FRACTURE      Social History   Socioeconomic History  . Marital status: Divorced    Spouse name: Not on file  . Number of children: Not on file  . Years of education: Not on file  . Highest education level: Not on file  Social Needs  . Financial resource strain: Not on file  . Food insecurity - worry: Not on file  . Food insecurity - inability: Not on file  . Transportation needs - medical: Not on file  . Transportation needs - non-medical: Not on file  Occupational History  . Occupation: disabled  Tobacco Use  . Smoking status: Current Every Day Smoker    Packs/day: 1.25    Years: 36.00    Pack years: 45.00    Types: Cigarettes  . Smokeless tobacco: Never Used  Substance and Sexual Activity  . Alcohol use: Yes    Comment: see HPI  . Drug use: No  . Sexual activity: No  Other Topics Concern  . Not on file  Social History Narrative  . Not on file    Allergies  Allergen Reactions  . Hornet Venom Anaphylaxis and Swelling    Family History  Problem Relation Age of Onset  . CVA Father     Prior to Admission medications   Medication Sig Start Date End Date Taking? Authorizing Provider  ciprofloxacin (CIPRO) 500 MG tablet Take 500 mg by mouth 2 (two) times daily.   Yes [provider]  clindamycin (  CLEOCIN) 300 MG capsule Take 300 mg by mouth 3 (three) times daily.   Yes [provider]  escitalopram (LEXAPRO) 10 MG tablet Take 10 mg by mouth daily.   Yes [provider]  furosemide (LASIX) 40 MG tablet Take 40 mg by mouth daily.   Yes [provider]  insulin glargine (LANTUS) 100 UNIT/ML injection Inject 0.15 mLs (15 Units total) into the skin at bedtime. Patient taking differently: Inject 20 Units into the skin at bedtime.  11/22/16  Yes Maxie Barb, MD  Lactobacillus (ACIDOPHILUS PO) Take 1 capsule by mouth daily.   Yes [provider]  spironolactone (ALDACTONE) 100 MG tablet Take 100 mg by mouth daily.   Yes [provider]  Alum & Mag Hydroxide-Simeth (GI COCKTAIL) SUSP suspension Take 30 mLs by mouth 3 (three) times daily as needed for indigestion. Shake well. Patient not taking: Reported on 11/08/2017 11/22/16   Maxie Barb, MD  insulin aspart (NOVOLOG) 100 UNIT/ML injection Inject 0-5 Units into the skin at bedtime. Patient not taking: Reported on 11/08/2017 11/22/16   Maxie Barb, MD  insulin aspart (NOVOLOG) 100 UNIT/ML injection Inject 0-9 Units into the skin 3 (three) times daily with meals. Patient taking differently: Inject 0-9 Units into the skin 3 (three) times daily with meals. Up to 35 units [place] daily 11/22/16   Maxie Barb, MD  lactulose [facility]) 10 GM/15ML solution Take 30 mLs (20 g total) by mouth every other day. Patient not taking: Reported on 11/08/2017 11/24/16   Maxie Barb, MD  magnesium oxide (MAG-OX) 400 (241.3 Mg) MG tablet Take 1 tablet (400 mg total) by mouth 2 (two) times daily. Patient not taking: Reported on 11/08/2017 11/22/16   Maxie Barb, MD  metoprolol tartrate (LOPRESSOR) 25 MG tablet Take 0.5 tablets (12.5 mg total) by mouth 2 (two) times daily. Patient not taking: Reported on 11/08/2017 11/22/16   Maxie Barb, MD  Multiple Vitamin (MULTIVITAMIN WITH MINERALS) TABS tablet Take 1 tablet by mouth daily. Patient not taking: Reported on 11/08/2017 11/23/16   Maxie Barb, MD  ondansetron (ZOFRAN) 4 MG tablet Take 1 tablet (4 mg total) by mouth every 6 (six) hours as needed for nausea. Patient not taking: Reported on 11/08/2017 11/22/16   Maxie Barb, MD  pantoprazole (PROTONIX) 40 MG tablet Take 1 tablet (40 mg total) by mouth daily at 6 (six) AM. 11/22/16   Maxie Barb, MD  prednisoLONE acetate (PRED FORTE) 1 % ophthalmic suspension Place 1 drop into the left eye 4 (four) times daily. 07/24/17    [provider]  rifaximin (XIFAXAN) 550 MG TABS tablet Take 1 tablet (550 mg total) by mouth 2 (two) times daily. Patient not taking: Reported on 11/08/2017 11/22/16   Maxie Barb, MD    Physical Exam: Vitals:   11/08/17 0800 11/08/17 1124 11/08/17 1130 11/08/17 1200  BP: (!) 144/83 (!) 148/84 (!) 167/92 (!) 161/98  Pulse: (!) 130 (!) 125 (!) 131 (!) 133  Resp: (!) 22 16 17 18   Temp:      TempSrc:      SpO2: 98% 95% 99% 100%  Weight:      Height:         General: Appears agitated and uncomfortable in bed Eyes:PERRL, EOMI, sclearae unicteric ENT: grossly normal hearing, lips & tongue, oral mm dry; poor dentition Neck: masses or thyromegaly; no carotid bruits Cardiovascular: Rapid Regular HR, no m/r/g. No LE edema.  Respiratory:  coarse BS bilaterally with left sided rhonchi.  Normal respiratory effort. Abdomen:soft, NT, slightly distended, BS present throughout Skin: severe erythema spreading from the lower back to the groin, scrotum and penis, with sloughing indurated skin and swelling; facial erythema with induration present Multiple scattered crusted lesions of the extremities Musculoskeletal: grossly normal ROM, no bony abnormality, muscle atrophy of the extremities present Lower extremity: No LE edema.  Limited foot exam with no ulcerations.  2+ distal pulses. Psychiatric: irritated mood and affect, poor historian, AOx3 Neurologic: CN 2-12 grossly intact, moves all extremities in coordinated fashion, sensation intact    Radiological Exams on Admission: Ct Pelvis W Contrast  Result Date: 11/08/2017 CLINICAL DATA:  Possible perianal abscess EXAM: CT PELVIS WITH CONTRAST TECHNIQUE: Multidetector CT imaging of the pelvis was performed using the standard protocol following the bolus administration of intravenous contrast. CONTRAST:  75 mL Isovue 300. COMPARISON:  None. FINDINGS: Urinary Tract: Bladder is well distended. No ureteral calculi are seen. Bowel: No  obstructive or inflammatory changes of the bowel are seen. The appendix is within normal limits. Vascular/Lymphatic: Atherosclerotic calcifications are noted. No sizable adenopathy is seen. Some scattered small inguinal lymph nodes are noted bilaterally. Reproductive:  Prostate is within normal limits. Other: No free pelvic fluid is noted. Fat containing inguinal hernias are noted bilaterally. No inflammatory changes are noted in the perirectal or perianal region to suggest abscess formation. Musculoskeletal: Degenerative changes of the lumbar spine are seen. IMPRESSION: No findings to suggest perianal or perirectal abscess are seen. Electronically Signed   By: Alcide CleverMark  Lukens M.D.   On: 11/08/2017 10:19    EKG: Independently reviewed.  SR with rate 129; nonspecific ST changes with no evidence of acute ischemia, VS in leads V1-V3 (not knew)   Labs on Admission: I have personally reviewed the available labs and imaging studies at the time of the admission.  Pertinent labs:   Lactic acid - 5.47 on presentation, 4.16 after hydration  ETOH level 210   Normal Na and K, normal renal function  WBC - 9.7, Hgb 10.4 Patient had CT of the pelvis that was negative for perineal or perirectal abscess   Assessment/Plan Principal Problem:   Candidal diaper dermatitis Active Problems:   Diabetes mellitus, type 2 (HCC)   Alcohol abuse   Generalized weakness   Tachycardia   HTN (hypertension)   Alcoholic cirrhosis of liver (HCC)   Dehydration   Candida Diaper Dermitis Will start Diflucan IV Keep area dry (condom cath or Foley cath) and apply barrier cream  Will check UA to r/o UTI  Dehydration secondary on ongoing alcohol abuse Weakness most likely associated with dehydration  Dehydration probably is a causative factor for elevated Lactic Acid Continue IV hydration, recheck Lactic acid Patient is tachycardic so will monitor on tele for 24 hours  Substance abuse  - alcohol and tobacco Patient  continues daily drinking of a cul of bourbon and smokes at least a pack of cigarettes a day Will initiate CIWA protocol  Hypertension - currently stable Continue home medication and adjust the doses if needed depending on the BP readings  DM type II Continue home doses of Insulin, add SSI, monitor FSBS   Jorge Baker , PA-C [facility]  If note is complete, please contact covering daytime or nighttime physician. [email or web address] Password [email or web address]  11/08/2017, 12:45 PM

## 2017-11-08 NOTE — ED Notes (Signed)
Breakfast tray ordered; carb modified, thin fluid

## 2017-11-08 NOTE — Progress Notes (Signed)
CRITICAL VALUE ALERT  Critical Value:  Lactic 2.3  Date & Time Notied:  11/08/2017 0725  Provider Notified: dr Ophelia Charteryates notified

## 2017-11-08 NOTE — ED Notes (Addendum)
MD in room at this time. Per MD patient allowed PO water.

## 2017-11-09 ENCOUNTER — Encounter (HOSPITAL_COMMUNITY): Payer: Self-pay | Admitting: General Practice

## 2017-11-09 DIAGNOSIS — L22 Diaper dermatitis: Secondary | ICD-10-CM

## 2017-11-09 DIAGNOSIS — R531 Weakness: Secondary | ICD-10-CM

## 2017-11-09 DIAGNOSIS — B372 Candidiasis of skin and nail: Principal | ICD-10-CM

## 2017-11-09 HISTORY — DX: Weakness: R53.1

## 2017-11-09 LAB — HEPATIC FUNCTION PANEL
ALK PHOS: 164 U/L — AB (ref 38–126)
ALT: 29 U/L (ref 17–63)
AST: 69 U/L — ABNORMAL HIGH (ref 15–41)
Albumin: 2 g/dL — ABNORMAL LOW (ref 3.5–5.0)
BILIRUBIN TOTAL: 1.3 mg/dL — AB (ref 0.3–1.2)
Bilirubin, Direct: 0.4 mg/dL (ref 0.1–0.5)
Indirect Bilirubin: 0.9 mg/dL (ref 0.3–0.9)
Total Protein: 5.4 g/dL — ABNORMAL LOW (ref 6.5–8.1)

## 2017-11-09 LAB — CBC
HCT: 26.1 % — ABNORMAL LOW (ref 39.0–52.0)
Hemoglobin: 8.6 g/dL — ABNORMAL LOW (ref 13.0–17.0)
MCH: 33.5 pg (ref 26.0–34.0)
MCHC: 33 g/dL (ref 30.0–36.0)
MCV: 101.6 fL — ABNORMAL HIGH (ref 78.0–100.0)
PLATELETS: 120 10*3/uL — AB (ref 150–400)
RBC: 2.57 MIL/uL — ABNORMAL LOW (ref 4.22–5.81)
RDW: 16.8 % — ABNORMAL HIGH (ref 11.5–15.5)
WBC: 7.2 10*3/uL (ref 4.0–10.5)

## 2017-11-09 LAB — BASIC METABOLIC PANEL
ANION GAP: 13 (ref 5–15)
Anion gap: 11 (ref 5–15)
BUN: 5 mg/dL — AB (ref 6–20)
BUN: 5 mg/dL — ABNORMAL LOW (ref 6–20)
CO2: 22 mmol/L (ref 22–32)
CO2: 18 mmol/L — AB (ref 22–32)
Calcium: 7.1 mg/dL — ABNORMAL LOW (ref 8.9–10.3)
Calcium: 6.7 mg/dL — ABNORMAL LOW (ref 8.9–10.3)
Chloride: 105 mmol/L (ref 101–111)
Chloride: 105 mmol/L (ref 101–111)
Creatinine, Ser: 0.66 mg/dL (ref 0.61–1.24)
Creatinine, Ser: 0.7 mg/dL (ref 0.61–1.24)
GFR calc Af Amer: >60 mL/min (ref >60)
GFR calc Af Amer: >60 mL/min (ref >60)
GFR calc non Af Amer: >60 mL/min (ref >60)
GLUCOSE: 165 mg/dL — AB (ref 65–99)
GLUCOSE: 200 mg/dL — AB (ref 65–99)
POTASSIUM: 3.9 mmol/L (ref 3.5–5.1)
POTASSIUM: 3.5 mmol/L (ref 3.5–5.1)
Sodium: 138 mmol/L (ref 135–145)
Sodium: 136 mmol/L (ref 135–145)

## 2017-11-09 LAB — GLUCOSE, CAPILLARY
GLUCOSE-CAPILLARY: 248 mg/dL — AB (ref 65–99)
GLUCOSE-CAPILLARY: 111 mg/dL — AB (ref 65–99)
Glucose-Capillary: 109 mg/dL — ABNORMAL HIGH (ref 65–99)
Glucose-Capillary: 109 mg/dL — ABNORMAL HIGH (ref 65–99)
Glucose-Capillary: 134 mg/dL — ABNORMAL HIGH (ref 65–99)
Glucose-Capillary: 148 mg/dL — ABNORMAL HIGH (ref 65–99)
Glucose-Capillary: 150 mg/dL — ABNORMAL HIGH (ref 65–99)
Glucose-Capillary: 167 mg/dL — ABNORMAL HIGH (ref 65–99)
Glucose-Capillary: 172 mg/dL — ABNORMAL HIGH (ref 65–99)
Glucose-Capillary: 219 mg/dL — ABNORMAL HIGH (ref 65–99)

## 2017-11-09 LAB — HEMOGLOBIN A1C
Hgb A1c MFr Bld: 6.6 % — ABNORMAL HIGH (ref 4.8–5.6)
Mean Plasma Glucose: 142.72 mg/dL

## 2017-11-09 LAB — HIV ANTIBODY (ROUTINE TESTING W REFLEX): HIV Screen 4th Generation wRfx: NONREACTIVE

## 2017-11-09 MED ORDER — INSULIN ASPART 100 UNIT/ML ~~LOC~~ SOLN
0.0000 [IU] | Freq: Three times a day (TID) | SUBCUTANEOUS | Status: DC
  Administered 2017-11-10: 7 [IU] via SUBCUTANEOUS
  Administered 2017-11-10: 2 [IU] via SUBCUTANEOUS
  Administered 2017-11-11: 7 [IU] via SUBCUTANEOUS
  Administered 2017-11-11: 5 [IU] via SUBCUTANEOUS

## 2017-11-09 MED ORDER — NYSTATIN 100000 UNIT/GM EX CREA
TOPICAL_CREAM | Freq: Three times a day (TID) | CUTANEOUS | Status: DC
  Administered 2017-11-09: 17:00:00 via TOPICAL
  Administered 2017-11-09: 1 via TOPICAL
  Administered 2017-11-10 – 2017-11-11 (×4): via TOPICAL
  Filled 2017-11-09 (×2): qty 15

## 2017-11-09 MED ORDER — INSULIN ASPART 100 UNIT/ML ~~LOC~~ SOLN: 0.0000 [IU] | SUBCUTANEOUS | Status: DC

## 2017-11-09 MED ORDER — SODIUM CHLORIDE 0.9 % IV SOLN
1.0000 g | Freq: Once | INTRAVENOUS | Status: AC
  Administered 2017-11-09: 1 g via INTRAVENOUS
  Filled 2017-11-09: qty 10

## 2017-11-09 MED ORDER — INSULIN GLARGINE 100 UNIT/ML ~~LOC~~ SOLN
10.0000 [IU] | SUBCUTANEOUS | Status: DC
  Administered 2017-11-09 – 2017-11-11 (×3): 10 [IU] via SUBCUTANEOUS
  Filled 2017-11-09 (×3): qty 0.1

## 2017-11-09 NOTE — Progress Notes (Signed)
Patient ID: Jorge ChimesJoseph Arnett, male   DOB: 07/23/1966, 52 y.o.   MRN: [id number]  PROGRESS NOTE    Jorge ChimesJoseph Dente  NWG:[id number] DOB: 07/13/1966 DOA: 11/08/2017 PCP: Lavell Islamloward, Davis L, MD  Brief Narrative:   52 year old male admitted for weakness and severe perineal rash.    Assessment & Plan:   Principal Problem:   Candidal diaper dermatitis-continue Diflucan.  Also placed nystatin cream to area.  Active Problems:   Diabetes mellitus, type 2 (HCC)-DKA resolved.  Off insulin drip.  Decrease IV fluids.  Gap closed.   Alcohol abuse-continue CIWA   Generalized weakness-likely mostly contributed to his chronic alcohol abuse.   Tachycardia-resolving   HTN (hypertension)-stable   Alcoholic cirrhosis of liver (HCC)-not withdrawing at this time   Dehydration-resolved     DVT prophylaxis: SCDs Code Status: Full  family Communication: None  disposition Plan: Likely tomorrow    Subjective: Patient been refusing IV meds and lab draws.  He is refusing any sort of short-term rehab.  Objective: Vitals:   11/09/17 0800 11/09/17 0900 11/09/17 1140 11/09/17 1200  BP:      Pulse: (!) 134 (!) 123 (!) 106 (!) 111  Resp: (!) 26 (!) 26 (!) 23 (!) 21  Temp:      TempSrc:      SpO2: 99% 97% (!) 89% 100%  Weight:      Height:        Intake/Output Summary (Last 24 hours) at 11/09/2017 1246 Last data filed at 11/09/2017 0900 Gross per 24 hour  Intake 1246.66 ml  Output -  Net 1246.66 ml   Filed Weights   11/08/17 0632 11/08/17 1734 11/09/17 0500  Weight: 56.7 kg (125 lb) 59 kg (130 lb 1.1 oz) 62.2 kg (137 lb 2 oz)    Examination:  General exam: Appears calm and comfortable  Respiratory system: Clear to auscultation. Respiratory effort normal. Cardiovascular system: S1 & S2 heard, RRR. No JVD, murmurs, rubs, gallops or clicks. No pedal edema. Gastrointestinal system: Abdomen is nondistended, soft and nontender. No organomegaly or masses felt. Normal bowel sounds heard. Central nervous  system: Alert and oriented. No focal neurological deficits. Extremities: Symmetric 5 x 5 power. Skin: Rash to perineum consistent with a candidal rash  psychiatry: Judgement and insight appear normal. Mood & affect appropriate.     Data Reviewed: I have personally reviewed following labs and imaging studies  CBC: Recent Labs  Lab 11/08/17 0638 11/08/17 1317 11/09/17 0226  WBC 9.7 7.4 7.2  HGB 10.4* 8.5* 8.6*  HCT 30.9* 24.8* 26.1*  MCV 101.3* 101.2* 101.6*  PLT 136* 110* 120*   Basic Metabolic Panel: Recent Labs  Lab 11/08/17 0638 11/08/17 1317 11/08/17 1804 11/08/17 2257 11/09/17 0226 11/09/17 0600  NA 139  --  137 137 138 136  K 4.3  --  4.0 4.0 3.9 3.5  CL 102  --  104 105 105 105  CO2 18*  --  20* 21* 22 18*  GLUCOSE 276*  --  262* 108* 165* 200*  BUN 8  --  7 6 5* 5*  CREATININE 0.81 0.76 0.84 0.69 0.66 0.70  CALCIUM 8.0*  --  7.2* 7.1* 7.1* 6.7*   GFR: Estimated Creatinine Clearance: 92.5 mL/min (by C-G formula based on SCr of 0.7 mg/dL). Liver Function Tests: Recent Labs  Lab 11/09/17 0226  AST 69*  ALT 29  ALKPHOS 164*  BILITOT 1.3*  PROT 5.4*  ALBUMIN 2.0*   No results for input(s): LIPASE, AMYLASE in the last 168  hours. No results for input(s): AMMONIA in the last 168 hours. Coagulation Profile: No results for input(s): INR, PROTIME in the last 168 hours. Cardiac Enzymes: No results for input(s): CKTOTAL, CKMB, CKMBINDEX, TROPONINI in the last 168 hours. BNP (last 3 results) No results for input(s): PROBNP in the last 8760 hours. HbA1C: Recent Labs    11/09/17 0226  HGBA1C 6.6*   CBG: Recent Labs  Lab 11/09/17 0412 11/09/17 0515 11/09/17 0620 11/09/17 0735 11/09/17 1146  GLUCAP 219* 248* 172* 109* 111*   Lipid Profile: No results for input(s): CHOL, HDL, LDLCALC, TRIG, CHOLHDL, LDLDIRECT in the last 72 hours. Thyroid Function Tests: No results for input(s): TSH, T4TOTAL, FREET4, T3FREE, THYROIDAB in the last 72 hours. Anemia  Panel: No results for input(s): VITAMINB12, FOLATE, FERRITIN, TIBC, IRON, RETICCTPCT in the last 72 hours. Sepsis Labs: Recent Labs  Lab 11/08/17 0850 11/08/17 1317 11/08/17 1804 11/08/17 2124  LATICACIDVEN 4.16* 3.3* 2.3* 1.1    No results found for this or any previous visit (from the past 240 hour(s)).       Radiology Studies: Ct Pelvis W Contrast  Result Date: 11/08/2017 CLINICAL DATA:  Possible perianal abscess EXAM: CT PELVIS WITH CONTRAST TECHNIQUE: Multidetector CT imaging of the pelvis was performed using the standard protocol following the bolus administration of intravenous contrast. CONTRAST:  75 mL Isovue 300. COMPARISON:  None. FINDINGS: Urinary Tract: Bladder is well distended. No ureteral calculi are seen. Bowel: No obstructive or inflammatory changes of the bowel are seen. The appendix is within normal limits. Vascular/Lymphatic: Atherosclerotic calcifications are noted. No sizable adenopathy is seen. Some scattered small inguinal lymph nodes are noted bilaterally. Reproductive:  Prostate is within normal limits. Other: No free pelvic fluid is noted. Fat containing inguinal hernias are noted bilaterally. No inflammatory changes are noted in the perirectal or perianal region to suggest abscess formation. Musculoskeletal: Degenerative changes of the lumbar spine are seen. IMPRESSION: No findings to suggest perianal or perirectal abscess are seen. Electronically Signed   By: Alcide Clever M.D.   On: 11/08/2017 10:19        Scheduled Meds: . acidophilus  1 capsule Oral Daily  . escitalopram  10 mg Oral Daily  . folic acid  1 mg Oral Daily  . Gerhardt's butt cream   Topical TID  . insulin aspart  0-9 Units Subcutaneous TID WC  . insulin glargine  10 Units Subcutaneous Q24H  . LORazepam  0-4 mg Intravenous Q6H   Followed by  . [START ON 11/10/2017] LORazepam  0-4 mg Intravenous Q12H  . multivitamin with minerals  1 tablet Oral Daily  . nystatin cream   Topical TID  .  pantoprazole  40 mg Oral Q0600  . spironolactone  100 mg Oral Daily  . thiamine  100 mg Oral Daily   Or  . thiamine  100 mg Intravenous Daily   Continuous Infusions: . sodium chloride 125 mL/hr at 11/09/17 0204  . calcium gluconate    . fluconazole (DIFLUCAN) IV Stopped (11/08/17 2109)  . insulin (NOVOLIN-R) infusion Stopped (11/09/17 0625)     LOS: 1 day    Time spent: 25 minutes    DAVID,RACHAL A, MD [facility] Pager [phone]  If 7PM-7AM, please contact night-coverage [email or web address] Password [facility] 11/09/2017, 12:46 PM

## 2017-11-09 NOTE — Progress Notes (Signed)
Physical Therapy Evaluation Patient Details Name: Jorge Baker MRN: [id number] DOB: 06/12/1966 Today's Date: 11/09/2017   History of Present Illness  Jorge ChimesJoseph Tague is a 52 y.o. male with a Past Medical History of DM; ETOH dependence; and recent admission (12/18) [facility] for GI bleed from duodenal ulcer/duodenitis/gastritis with C diff and DKA who presents with weakness for several days.  He was having trouble getting around and was found sitting in his own feces and urine.  He reports significant groin discomfort; he does wear Depends at home and is excoriated with erythema in the distribution of an undergarment.  Clinical Impression  Pt admitted with/for the above stated problems.  Pt presently needing needing mod to max assist for minimal/basic mobility.  Pt currently limited functionally due to the problems listed. ( See problems list.)   Pt will benefit from PT to maximize function and safety in order to get ready for next venue listed below.     Follow Up Recommendations SNF;Supervision/Assistance - 24 hour;Other (comment)(hopefully will perk back up.)    Equipment Recommendations  None recommended by PT    Recommendations for Other Services       Precautions / Restrictions Precautions Precautions: Fall      Mobility  Bed Mobility Overal bed mobility: Needs Assistance Bed Mobility: Supine to Sit     Supine to sit: HOB elevated;Mod assist     General bed mobility comments: pt still lethargic as he was assisted to EOB, but as he awoke, he only needed min to scoot.  Transfers Overall transfer level: Needs assistance Equipment used: 1 person hand held assist(monitor pole) Transfers: Sit to/from [employer]Stand;Squat Pivot Transfers Sit to Stand: Mod assist;Max assist   Squat pivot transfers: Mod assist     General transfer comment: initially needed max to come to squat-like stance.  2nd trial with face to face assist, pt needed mod assist to come more upright, never attaining  an erect posture.  pt stayed in a squat to pivot to the chair.  He neede significant support for stability-w/shift so he could take 2 pivotal steps.  Ambulation/Gait             General Gait Details: not able  Stairs            Wheelchair Mobility    Modified Rankin (Stroke Patients Only)       Balance Overall balance assessment: Needs assistance   Sitting balance-Leahy Scale: Fair       Standing balance-Leahy Scale: Poor Standing balance comment: needs external support                             Pertinent Vitals/Pain Pain Assessment: Faces Faces Pain Scale: Hurts little more Pain Location: perineum Pain Descriptors / Indicators: Burning;Discomfort;Grimacing Pain Intervention(s): Monitored during session    Home Living Family/patient expects to be discharged to:: Private residence Living Arrangements: Parent Available Help at Discharge: Family;Available PRN/intermittently Type of Home: House Home Access: Ramped entrance;Stairs to enter     Home Layout: One level Home Equipment: Walker - 2 wheels;Cane - single point;Wheelchair - manual      Prior Function           Comments: pt too lethargic, then too apathetic to expound on questions asked.  I didn't get any details on his PLOF.     Hand Dominance        Extremity/Trunk Assessment   Upper Extremity Assessment Upper Extremity Assessment: Defer to  OT evaluation    Lower Extremity Assessment Lower Extremity Assessment: Generalized weakness;RLE deficits/detail;LLE deficits/detail RLE Deficits / Details: weak and stiff overall, mild knee extention contractures  grossly 3/5 RLE Coordination: decreased fine motor;decreased gross motor LLE Deficits / Details: L LE similar to R LE.       Communication   Communication: No difficulties  Cognition Arousal/Alertness: Lethargic Behavior During Therapy: Flat affect;Agitated Overall Cognitive Status: No family/caregiver present to  determine baseline cognitive functioning(but is impairedon evaluation)                                 General Comments: not oriented to time, place, situation      General Comments      Exercises     Assessment/Plan    PT Assessment Patient needs continued PT services  PT Problem List Decreased strength;Decreased activity tolerance;Decreased balance;Decreased mobility;Decreased coordination       PT Treatment Interventions Gait training;Functional mobility training;Therapeutic activities;Therapeutic exercise;Balance training;Patient/family education;DME instruction    PT Goals (Current goals can be found in the Care Plan section)  Acute Rehab PT Goals PT Goal Formulation: With patient Time For Goal Achievement: 11/23/17 Potential to Achieve Goals: Fair    Frequency Min 3X/week   Barriers to discharge        Co-evaluation               AM-PAC PT "6 Clicks" Daily Activity  Outcome Measure Difficulty turning over in bed (including adjusting bedclothes, sheets and blankets)?: A Lot Difficulty moving from lying on back to sitting on the side of the bed? : Unable Difficulty sitting down on and standing up from a chair with arms (e.g., wheelchair, bedside commode, etc,.)?: Unable Help needed moving to and from a bed to chair (including a wheelchair)?: A Lot Help needed walking in hospital room?: A Lot Help needed climbing 3-5 steps with a railing? : Total 6 Click Score: 9    End of Session   Activity Tolerance: Patient tolerated treatment well;Patient limited by fatigue Patient left: in chair;with call bell/phone within reach;with chair alarm set Nurse Communication: Mobility status PT Visit Diagnosis: Unsteadiness on feet (R26.81);Muscle weakness (generalized) (M62.81);Other abnormalities of gait and mobility (R26.89);Difficulty in walking, not elsewhere classified (R26.2)    Time: 1610-9604 PT Time Calculation (min) (ACUTE ONLY): 34  min   Charges:   PT Evaluation $PT Eval Moderate Complexity: 1 Mod PT Treatments $Therapeutic Activity: 8-22 mins   PT G Codes:        09-Dec-2017  Drummond Bing, PT [phone]  (pager)  Eliseo Gum Rayven Rettig 2017/12/09, 5:01 PM

## 2017-11-10 DIAGNOSIS — R531 Weakness: Secondary | ICD-10-CM

## 2017-11-10 DIAGNOSIS — L03317 Cellulitis of buttock: Secondary | ICD-10-CM

## 2017-11-10 LAB — GLUCOSE, CAPILLARY
GLUCOSE-CAPILLARY: 120 mg/dL — AB (ref 65–99)
GLUCOSE-CAPILLARY: 127 mg/dL — AB (ref 65–99)
GLUCOSE-CAPILLARY: 148 mg/dL — AB (ref 65–99)
Glucose-Capillary: 302 mg/dL — ABNORMAL HIGH (ref 65–99)
Glucose-Capillary: 189 mg/dL — ABNORMAL HIGH (ref 65–99)
Glucose-Capillary: 60 mg/dL — ABNORMAL LOW (ref 65–99)

## 2017-11-10 LAB — BASIC METABOLIC PANEL
Anion gap: 13 (ref 5–15)
CALCIUM: 7.1 mg/dL — AB (ref 8.9–10.3)
CO2: 21 mmol/L — ABNORMAL LOW (ref 22–32)
CREATININE: 0.71 mg/dL (ref 0.61–1.24)
Chloride: 102 mmol/L (ref 101–111)
GFR calc Af Amer: >60 mL/min (ref >60)
Glucose, Bld: 210 mg/dL — ABNORMAL HIGH (ref 65–99)
POTASSIUM: 4.7 mmol/L (ref 3.5–5.1)
SODIUM: 136 mmol/L (ref 135–145)

## 2017-11-10 MED ORDER — TAMSULOSIN HCL 0.4 MG PO CAPS
0.4000 mg | ORAL_CAPSULE | Freq: Every day | ORAL | Status: DC
  Administered 2017-11-10 – 2017-11-11 (×2): 0.4 mg via ORAL
  Filled 2017-11-10 (×2): qty 1

## 2017-11-10 MED ORDER — SODIUM CHLORIDE 0.9 % IV BOLUS (SEPSIS)
500.0000 mL | Freq: Once | INTRAVENOUS | Status: AC
  Administered 2017-11-10: 500 mL via INTRAVENOUS

## 2017-11-10 MED ORDER — METOPROLOL SUCCINATE ER 25 MG PO TB24
25.0000 mg | ORAL_TABLET | Freq: Every day | ORAL | Status: DC
  Administered 2017-11-10 – 2017-11-11 (×2): 25 mg via ORAL
  Filled 2017-11-10 (×2): qty 1
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
## 2017-11-10 NOTE — Progress Notes (Signed)
Pt refused to get out of bed at this time pt requested to take a nap. Informed pt this RN will be back and will be assisted to chair. Will continue to monitor.

## 2017-11-10 NOTE — Plan of Care (Signed)
  Progressing Clinical Measurements: Respiratory complications will improve 11/10/2017 1340 - Progressing by Marcelino ScotWilson, Kella Splinter G, RN Activity: Risk for activity intolerance will decrease 11/10/2017 1340 - Progressing by Marcelino ScotWilson, Dutch Ing G, RN Nutrition: Adequate nutrition will be maintained 11/10/2017 1340 - Progressing by Marcelino ScotWilson, Kyley Laurel G, RN Elimination: Will not experience complications related to bowel motility 11/10/2017 1340 - Progressing by Marcelino ScotWilson, Decklan Mau G, RN Skin Integrity: Risk for impaired skin integrity will decrease 11/10/2017 1340 - Progressing by Marcelino ScotWilson, Bren Steers G, RN

## 2017-11-10 NOTE — Progress Notes (Signed)
Pt HR sustaining 130's MD made aware. Pt is asleep in bed. No new orders at this time. Will continue to monitor.

## 2017-11-11 DIAGNOSIS — F1010 Alcohol abuse, uncomplicated: Secondary | ICD-10-CM

## 2017-11-11 DIAGNOSIS — E860 Dehydration: Secondary | ICD-10-CM

## 2017-11-11 LAB — BASIC METABOLIC PANEL
Anion gap: 13 (ref 5–15)
BUN: <5 mg/dL — ABNORMAL LOW (ref 6–20)
CALCIUM: 7.4 mg/dL — AB (ref 8.9–10.3)
CO2: 20 mmol/L — AB (ref 22–32)
Chloride: 102 mmol/L (ref 101–111)
Creatinine, Ser: 0.75 mg/dL (ref 0.61–1.24)
Glucose, Bld: 291 mg/dL — ABNORMAL HIGH (ref 65–99)
POTASSIUM: 3.7 mmol/L (ref 3.5–5.1)
Sodium: 135 mmol/L (ref 135–145)

## 2017-11-11 LAB — GLUCOSE, CAPILLARY
GLUCOSE-CAPILLARY: 286 mg/dL — AB (ref 65–99)
Glucose-Capillary: 154 mg/dL — ABNORMAL HIGH (ref 65–99)
Glucose-Capillary: 219 mg/dL — ABNORMAL HIGH (ref 65–99)
Glucose-Capillary: 323 mg/dL — ABNORMAL HIGH (ref 65–99)

## 2017-11-11 MED ORDER — METOPROLOL SUCCINATE ER 25 MG PO TB24: 25.0000 mg | ORAL_TABLET | Freq: Every day | ORAL | 0 refills | Status: AC

## 2017-11-11 MED ORDER — NYSTATIN 100000 UNIT/GM EX CREA: TOPICAL_CREAM | Freq: Three times a day (TID) | CUTANEOUS | 0 refills | Status: AC

## 2017-11-11 NOTE — Progress Notes (Signed)
Pt discharged to home. PIVs removed. AVS reviewed. Prescriptions provided to pt. Pt left unit with belongings in hand, including cellphone and charger, prescriptions and keys. Pt to be transported home by taxi.

## 2017-11-11 NOTE — Discharge Summary (Signed)
Physician Discharge Summary  Jorge Baker ZOX:[id number] DOB: September 30, 1966 DOA: 11/08/2017  PCP: Lavell Islam, MD  Admit date: 11/08/2017 Discharge date: 11/11/2017  Time spent: 40 minutes  Recommendations for Outpatient Follow-up:  1. Resolution of diaper rash 2. Glycemic control 3. Compliance with medications   Discharge Diagnoses:  Principal Problem:   Candidal diaper dermatitis Active Problems:   Diabetes mellitus, type 2 (HCC)   Alcohol abuse   Generalized weakness   Tachycardia   HTN (hypertension)   Alcoholic cirrhosis of liver (HCC)   Dehydration   Discharge Condition: Improved but still chronically debilitated  Diet recommendation: Healthy heart diabetic  Filed Weights   11/09/17 0500 11/10/17 0500 11/11/17 0500  Weight: 62.2 kg (137 lb 2 oz) 59.3 kg (130 lb 11.7 oz) 58.3 kg (128 lb 8.5 oz)     Hospital Course:  52 year old male who presents with weakness and severe perineal candidal rash and DKA.  He was placed on a insulin drip given IV fluids and his DKA resolved along with his dehydration.  He was started on Diflucan and nystatin cream and has much improvement in the rash.  Patient is chronically debilitated at home.  I have offered him for the last 3 days short-term rehab so that he can get aggressive physical therapy.  He refuses this.  He has been refusing a lot of treatment here in the hospital.  He does not participate in physical therapy.  Patient has the capacity to make his own decisions at this time.  He does not want any rehab that is offered and wishes to go home.  Concerning his rash he has had over 50% improvement with 4 days of IV Diflucan and nystatin cream.  Is a long-standing history of alcohol abuse and noncompliance with his medications at home.  He has been educated to take his insulin.  Will discharge patient home with home health physical therapy and also social work evaluation at home.  Patient is being discharged home in stable and improved  and improved condition high likelihood of being readmitted frequently.  Discharge Exam: Vitals:   11/11/17 0818 11/11/17 1035  BP:  134/79  Pulse:  (!) 116  Resp:    Temp: 98.4 F (36.9 C)   SpO2:      General: Alert and oriented no apparent distress Cardiovascular: Regular rate and rhythm without murmurs rubs or gallops Respiratory: Clear to auscultation bilaterally no wheezes or rales  Discharge Instructions   Discharge Instructions    Diet - low sodium heart healthy   Complete by:  As directed    Increase activity slowly   Complete by:  As directed      Allergies as of 11/11/2017      Reactions   Hornet Venom Anaphylaxis, Swelling      Medication List    STOP taking these medications   ciprofloxacin 500 MG tablet Commonly known as:  CIPRO   clindamycin 300 MG capsule Commonly known as:  CLEOCIN     TAKE these medications   ACIDOPHILUS PO Take 1 capsule by mouth daily.   escitalopram 10 MG tablet Commonly known as:  LEXAPRO Take 10 mg by mouth daily.   furosemide 40 MG tablet Commonly known as:  LASIX Take 40 mg by mouth daily.   insulin aspart 100 UNIT/ML injection Commonly known as:  novoLOG Inject 0-9 Units into the skin 3 (three) times daily with meals. What changed:  additional instructions   insulin glargine 100 UNIT/ML injection Commonly known as:  LANTUS Inject 0.15 mLs (15 Units total) into the skin at bedtime. What changed:  how much to take   metoprolol succinate 25 MG 24 hr tablet Commonly known as:  TOPROL-XL Take 1 tablet (25 mg total) by mouth daily. Start taking on:  11/12/2017   nystatin cream Commonly known as:  MYCOSTATIN Apply topically 3 (three) times daily. X 7 days   pantoprazole 40 MG tablet Commonly known as:  PROTONIX Take 1 tablet (40 mg total) by mouth daily at 6 (six) AM.   prednisoLONE acetate 1 % ophthalmic suspension Commonly known as:  PRED FORTE Place 1 drop into the left eye 4 (four) times daily.    spironolactone 100 MG tablet Commonly known as:  ALDACTONE Take 100 mg by mouth daily.      Allergies  Allergen Reactions  . Hornet Venom Anaphylaxis and Swelling      The results of significant diagnostics from this hospitalization (including imaging, microbiology, ancillary and laboratory) are listed below for reference.    Significant Diagnostic Studies: Ct Pelvis W Contrast  Result Date: 11/08/2017 CLINICAL DATA:  Possible perianal abscess EXAM: CT PELVIS WITH CONTRAST TECHNIQUE: Multidetector CT imaging of the pelvis was performed using the standard protocol following the bolus administration of intravenous contrast. CONTRAST:  75 mL Isovue 300. COMPARISON:  None. FINDINGS: Urinary Tract: Bladder is well distended. No ureteral calculi are seen. Bowel: No obstructive or inflammatory changes of the bowel are seen. The appendix is within normal limits. Vascular/Lymphatic: Atherosclerotic calcifications are noted. No sizable adenopathy is seen. Some scattered small inguinal lymph nodes are noted bilaterally. Reproductive:  Prostate is within normal limits. Other: No free pelvic fluid is noted. Fat containing inguinal hernias are noted bilaterally. No inflammatory changes are noted in the perirectal or perianal region to suggest abscess formation. Musculoskeletal: Degenerative changes of the lumbar spine are seen. IMPRESSION: No findings to suggest perianal or perirectal abscess are seen. Electronically Signed   By: Alcide Clever M.D.   On: 11/08/2017 10:19    Microbiology: Recent Results (from the past 240 hour(s))  Blood culture (routine x 2)     Status: None (Preliminary result)   Collection Time: 11/08/17  9:15 AM  Result Value Ref Range Status   Specimen Description BLOOD RIGHT FOREARM  Final   Special Requests   Final    BOTTLES DRAWN AEROBIC AND ANAEROBIC Blood Culture results may not be optimal due to an inadequate volume of blood received in culture bottles   Culture   Final     NO GROWTH 2 DAYS Performed at [facility], [address], [place], [place] [address]    Report Status PENDING  Incomplete  Culture, blood (Routine X 2) w Reflex to ID Panel     Status: None (Preliminary result)   Collection Time: 11/08/17  9:15 AM  Result Value Ref Range Status   Specimen Description BLOOD WRIST RIGHT  Final   Special Requests   Final    BOTTLES DRAWN AEROBIC AND ANAEROBIC Blood Culture adequate volume   Culture   Final    NO GROWTH 2 DAYS Performed at [facility], [address] [place], [place] [address]    Report Status PENDING  Incomplete     Labs: Basic Metabolic Panel: Recent Labs  Lab 11/08/17 2257 11/09/17 0226 11/09/17 0600 11/10/17 0441 11/11/17 0708  NA 137 138 136 136 135  K 4.0 3.9 3.5 4.7 3.7  CL 105 105 105 102 102  CO2  21* 22 18* 21* 20*  GLUCOSE 108* 165* 200* 210* 291*  BUN 6 5* 5* <5* <5*  CREATININE 0.69 0.66 0.70 0.71 0.75  CALCIUM 7.1* 7.1* 6.7* 7.1* 7.4*   Liver Function Tests: Recent Labs  Lab 11/09/17 0226  AST 69*  ALT 29  ALKPHOS 164*  BILITOT 1.3*  PROT 5.4*  ALBUMIN 2.0*   No results for input(s): LIPASE, AMYLASE in the last 168 hours. No results for input(s): AMMONIA in the last 168 hours. CBC: Recent Labs  Lab 11/08/17 0638 11/08/17 1317 11/09/17 0226  WBC 9.7 7.4 7.2  HGB 10.4* 8.5* 8.6*  HCT 30.9* 24.8* 26.1*  MCV 101.3* 101.2* 101.6*  PLT 136* 110* 120*   Cardiac Enzymes: No results for input(s): CKTOTAL, CKMB, CKMBINDEX, TROPONINI in the last 168 hours. BNP: BNP (last 3 results) No results for input(s): BNP in the last 8760 hours.  ProBNP (last 3 results) No results for input(s): PROBNP in the last 8760 hours.  CBG: Recent Labs  Lab 11/10/17 1706 11/10/17 1801 11/10/17 1953 11/11/17 0012 11/11/17 0435  GLUCAP 60* 127* 120* 154* 219*       Signed:  DAVID,RACHAL A MD.  [facility] 11/11/2017, 10:58 AM

## 2017-11-11 NOTE — Progress Notes (Signed)
CSW completed assessment with pt.  Pt refused SNF and Substance Abuse Treatment Facilities information.CSW consulted with Westley GamblesMelaine, RN concerning transportation needs for pt.  CSW gave Melaine taxi voucher if needed for disposition planning.  No further needs are indicated at this time.  CSW signing off.   Budd Palmerara Kerry Chisolm LCSWA [phone]

## 2017-11-11 NOTE — Care Management Note (Signed)
Case Management Note  Patient Details  Name: Jorge Baker MRN: [id number] Date of Birth: 08/03/1966  Subjective/Objective:       Pt from home with parents.  Pt states he has HH agency but doesn't know the last time they came out or who the agency was.  [facility] is familiar to him.            Action/Plan: Byrd Hesselbachontacted Jermaine, liaison for [facility].  [facility] will check to see if pt is active and take new referral.    Expected Discharge Date:  11/11/17               Expected Discharge Plan:  Home w Home Health Services  In-House Referral:  NA  Discharge planning Services  CM Consult  Post Acute Care Choice:  Home Health Choice offered to:  Patient  DME Arranged:  N/A DME Agency:  NA  HH Arranged:  PT, RN, SW HH Agency:  [facility]  Status of Service:  Completed, signed off  If discussed at Long Length of Stay Meetings, dates discussed:    Additional Comments:  Verdene LennertGoldean, Firmin Belisle K, RN 11/11/2017, 1:32 PM

## 2017-11-12 MED FILL — METOPROLOL SUCCINATE ER 25: 25 | 30 days supply | Qty: 30 | Fill #0

## 2017-11-12 MED FILL — NYSTATIN 100,000 UNIT/GM CR: 100000 | 7 days supply | Qty: 30 | Fill #0

## 2017-11-13 LAB — CULTURE, BLOOD (ROUTINE X 2)
CULTURE: NO GROWTH
Culture: NO GROWTH
Special Requests: ADEQUATE

## 2017-11-16 MED FILL — GERHARDT'S BUTT CREAM: 100000 | 21 days supply | Qty: 90 | Fill #0

## 2017-12-18 MED FILL — ACCU-CHEK GUIDE TEST STRIP: 50 days supply | Qty: 200 | Fill #2

## 2018-01-10 MED FILL — METOPROLOL SUCCINATE ER 25: 25 | 30 days supply | Qty: 30 | Fill #0

## 2018-01-15 MED FILL — hydrOXYzine HCL 10 MG TABS: 10 | 5 days supply | Qty: 30 | Fill #0

## 2018-01-21 MED FILL — LANTUS SOLOSTAR 100 UNITS/M: 100 | 30 days supply | Qty: 3 | Fill #0

## 2018-02-07 MED FILL — LORazepam 1 MG TABS: 1 | 15 days supply | Qty: 90 | Fill #0

## 2018-03-09 DEATH — deceased

## 2018-07-04 IMAGING — US US ABDOMEN LIMITED
1 series · 14 of 25 positions shown · non-contrast
Comparison: None.

CLINICAL DATA: Right upper quadrant pain, increased transaminases

EXAM:
US ABDOMEN LIMITED - RIGHT UPPER QUADRANT

[Series 1: us abdomen limited · 0.14mm/px · 14 of 51 slices shown]
[im 1/51]
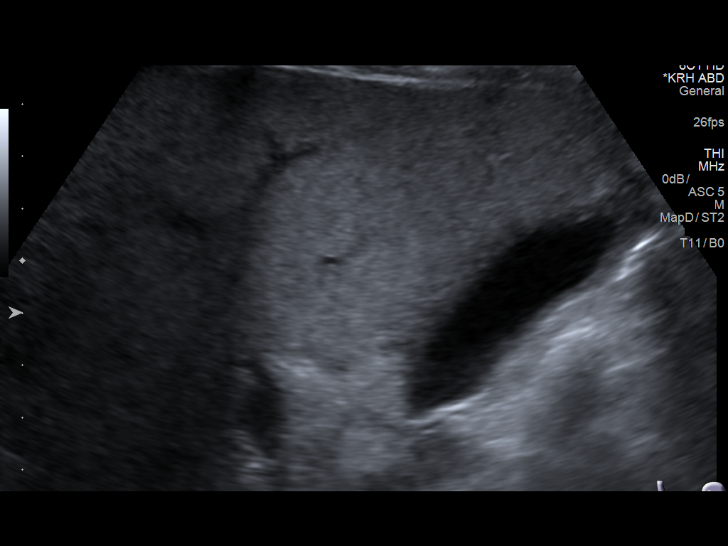
[im 5/51]
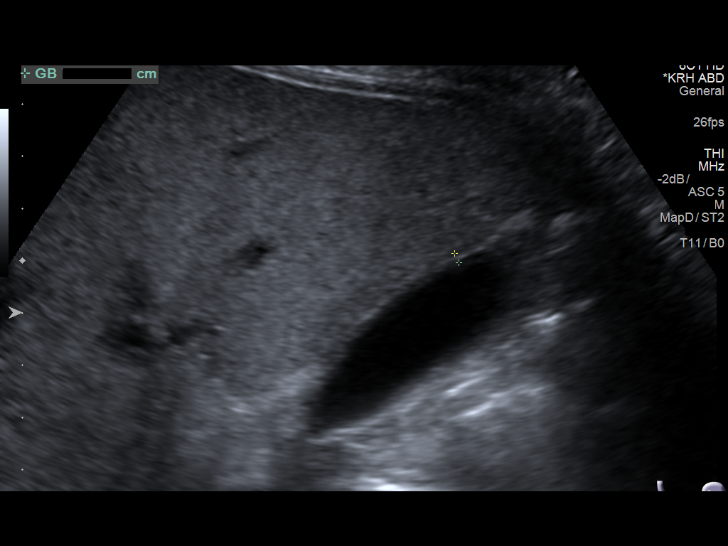
[im 9/51]
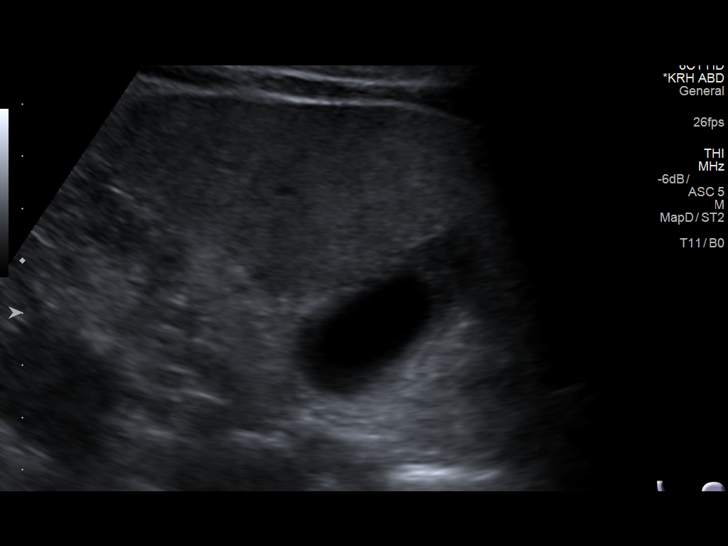
[im 13/51]
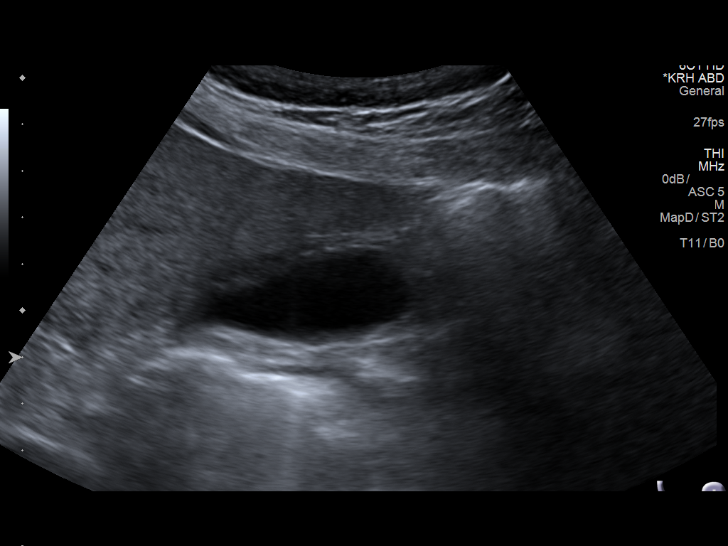
[im 17/51]
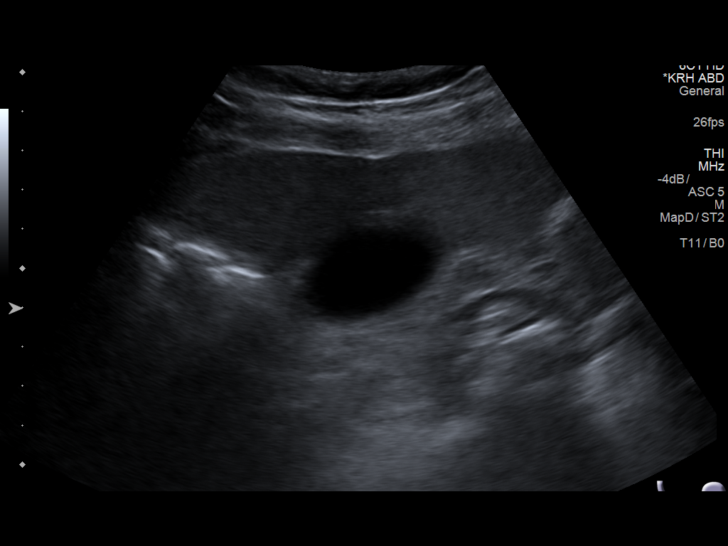
[im 19/51]
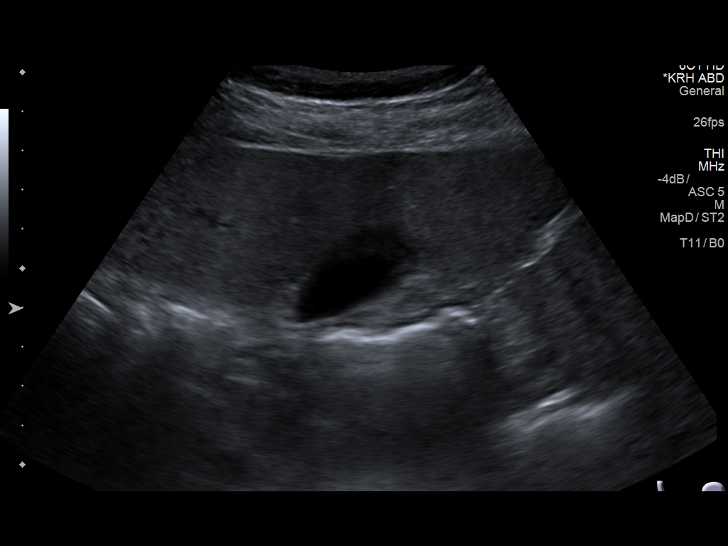
[im 23/51]
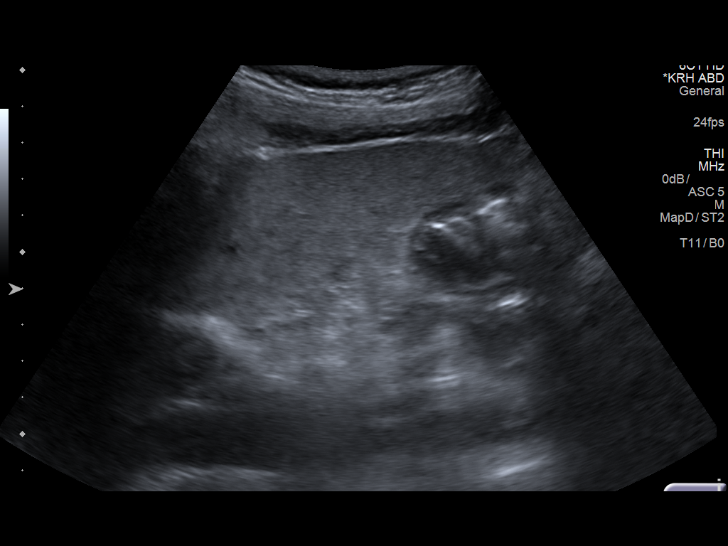
[im 28/51]
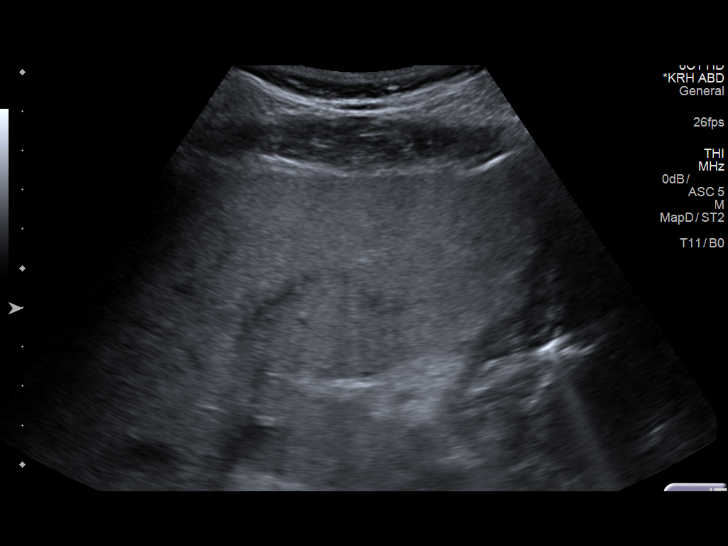
[im 32/51]
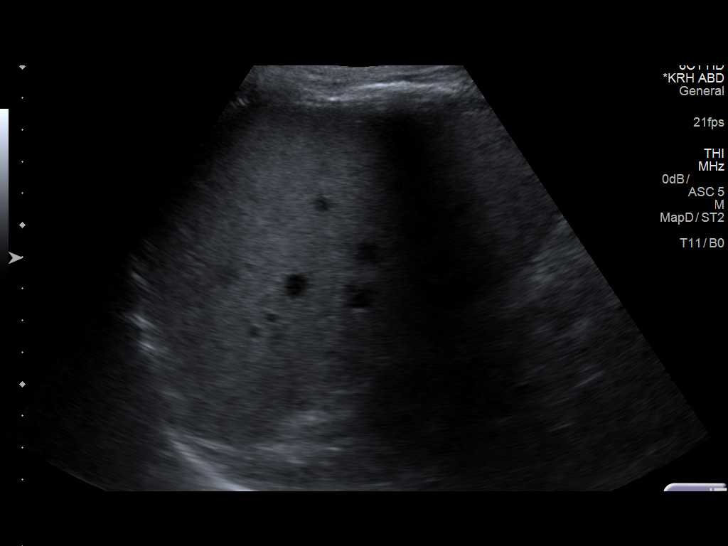
[im 34/51]
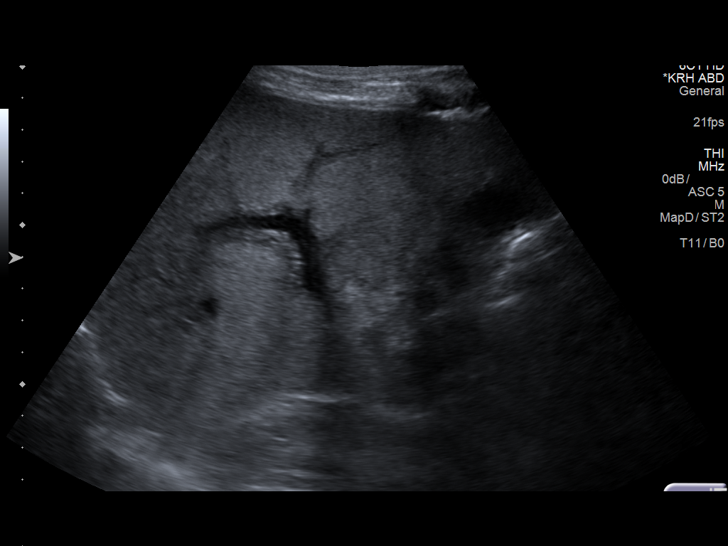
[im 38/51]
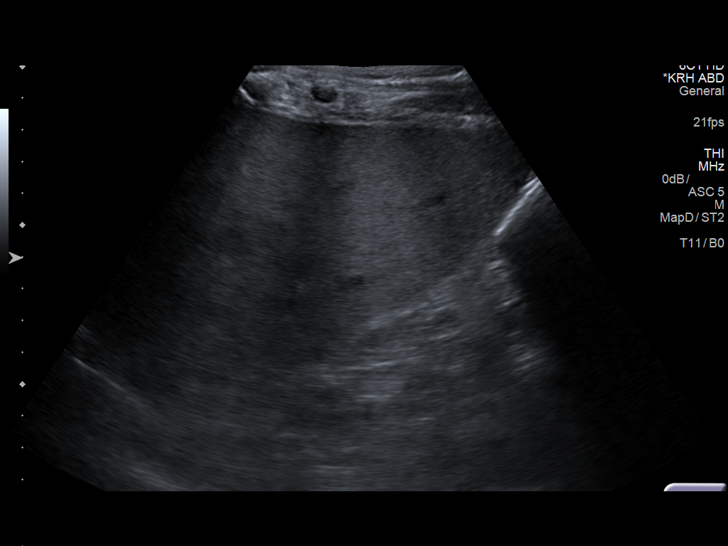
[im 42/51]
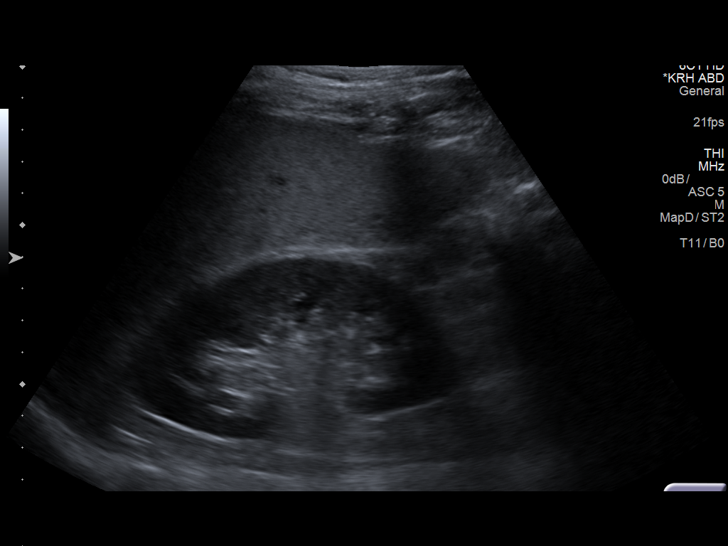
[im 46/51]
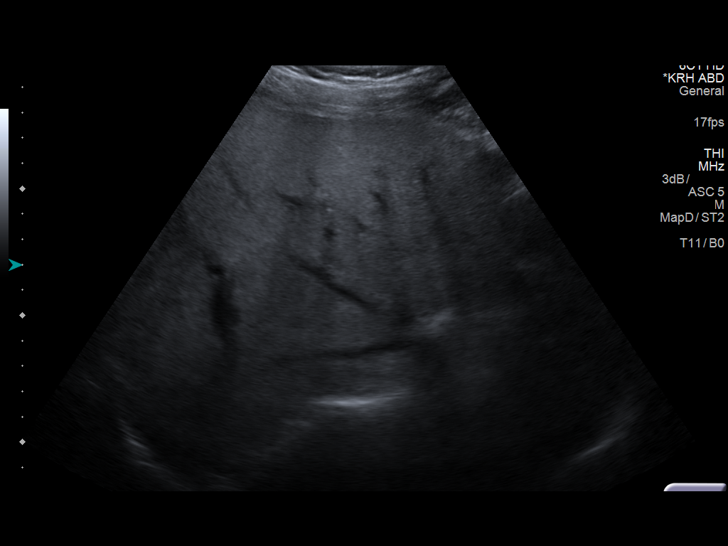
[im 51/51]
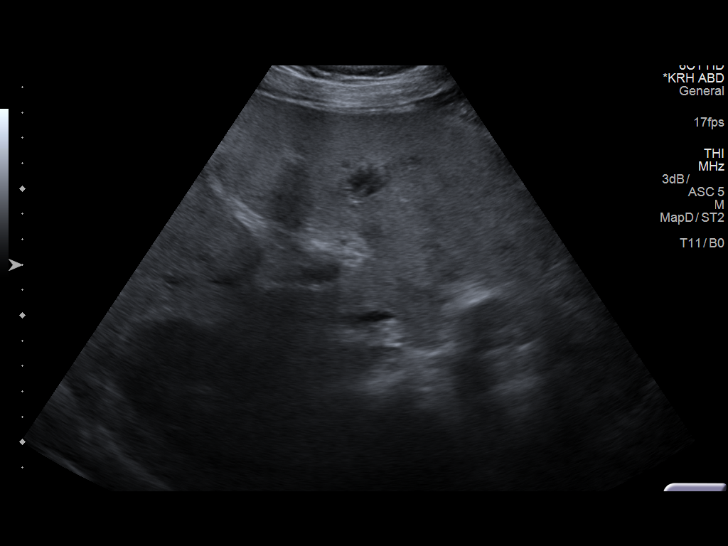

[14 of 25 positions shown; findings below may reference images not displayed]

FINDINGS: Gallbladder:

No gallstones or wall thickening visualized. No sonographic Murphy
sign noted by sonographer.

Common bile duct:

Diameter: 4.2 mm in diameter within normal limits.

Liver:

No focal lesion identified. There is diffuse increased echogenicity
of the liver consistent with fatty infiltration.
IMPRESSION: No gallstones are noted within gallbladder. Normal CBD. Diffuse
increased echogenicity of the liver consistent with fatty
infiltration.

## 2018-11-22 IMAGING — US US SCROTUM
1 series · 14 of 25 positions shown · non-contrast
Comparison: None.

CLINICAL DATA: Scrotal swelling since this morning.

EXAM:
ULTRASOUND OF SCROTUM
TECHNIQUE: Complete ultrasound examination of the testicles, epididymis, and
other scrotal structures was performed.

[Series 1: us scrotum · 0.09mm/px · 14 of 37 slices shown]
[im 1/37]
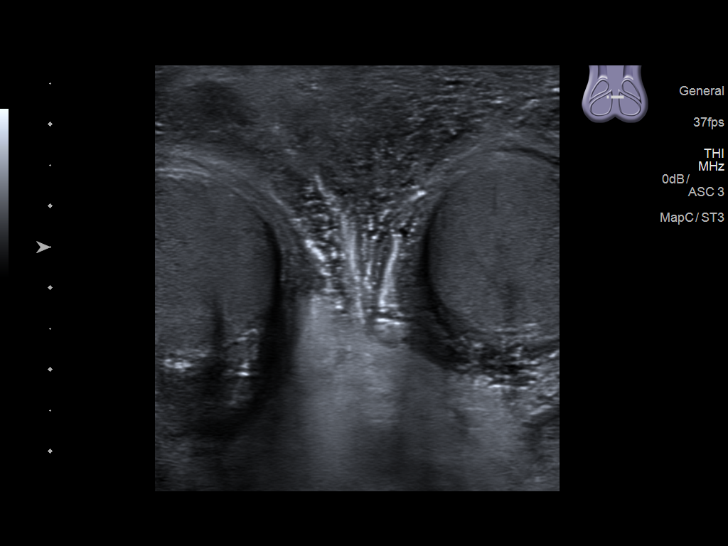
[im 4/37]
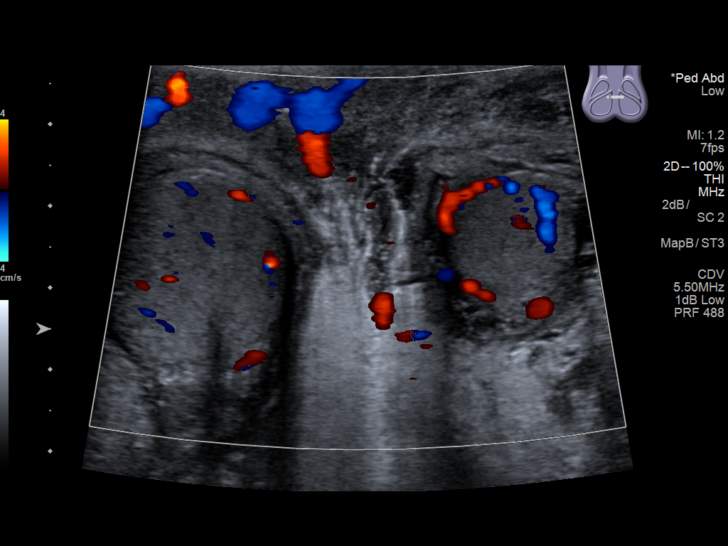
[im 7/37]
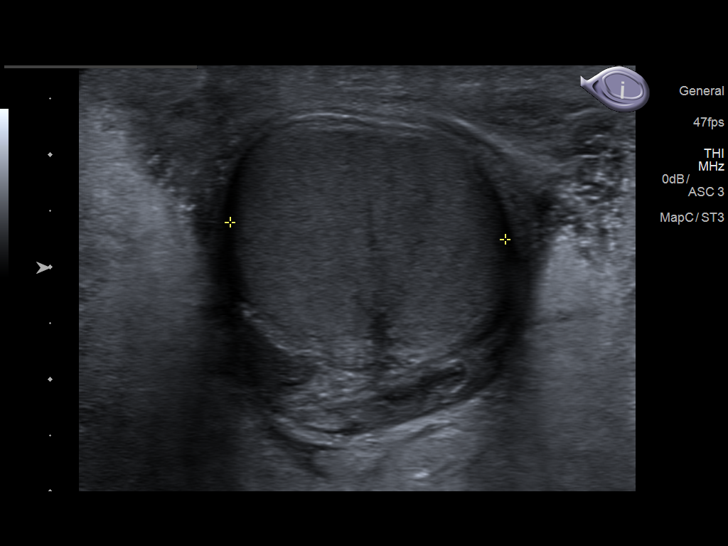
[im 10/37]
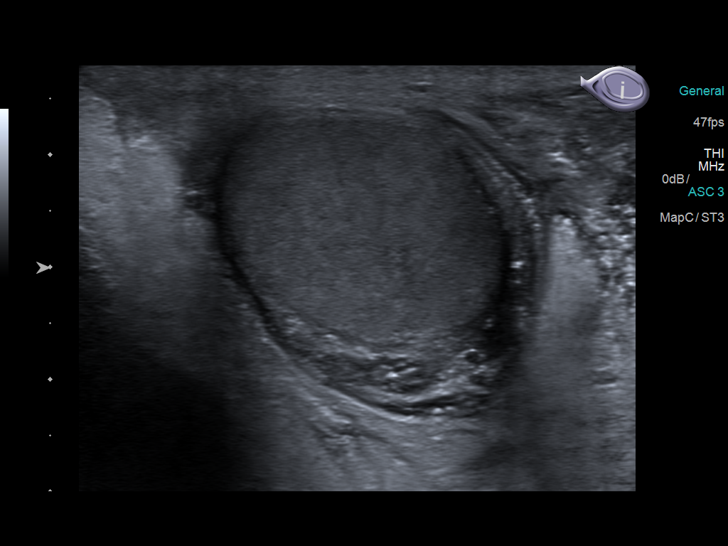
[im 13/37]
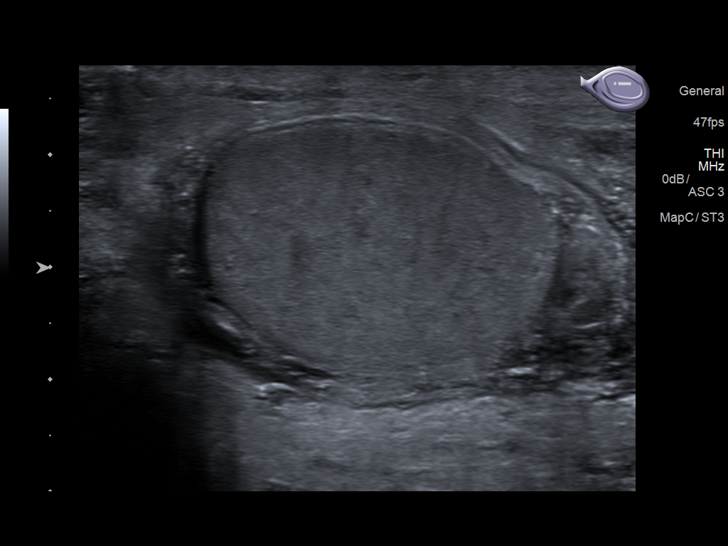
[im 14/37]
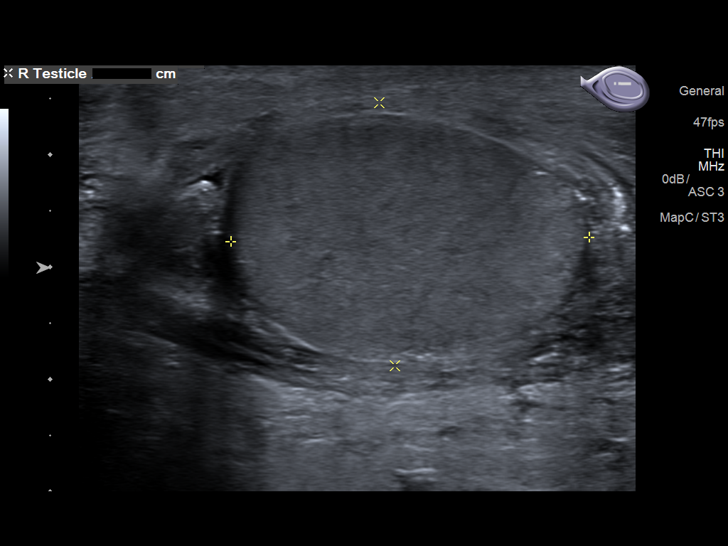
[im 17/37]
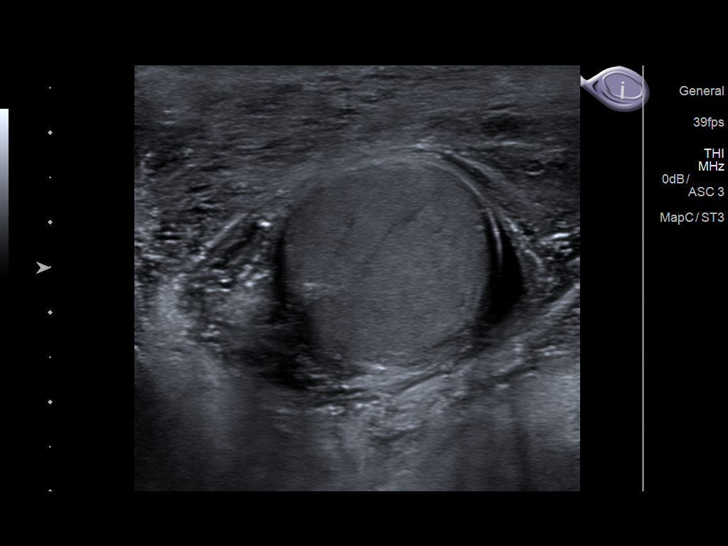
[im 20/37]
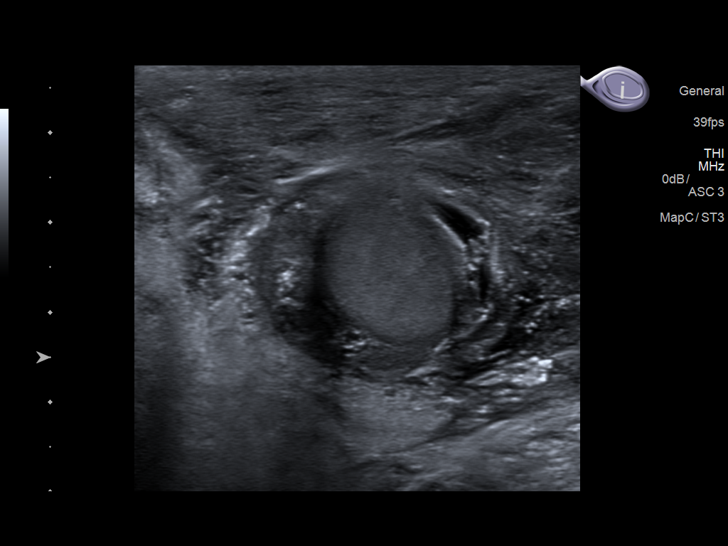
[im 23/37]
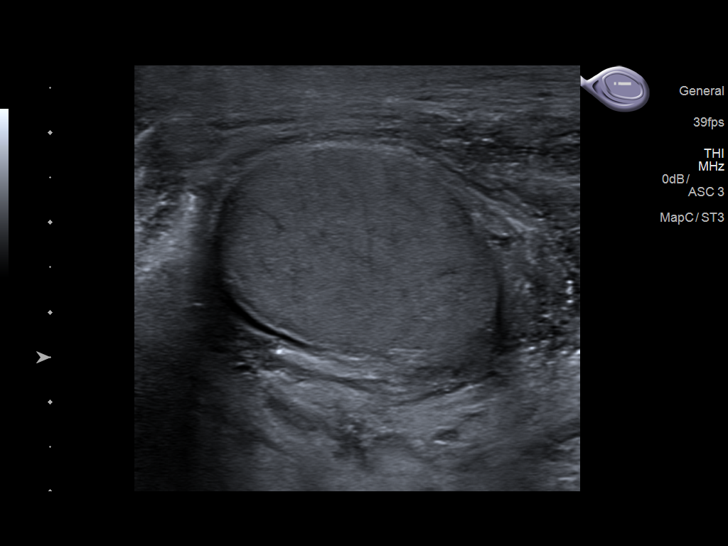
[im 25/37]
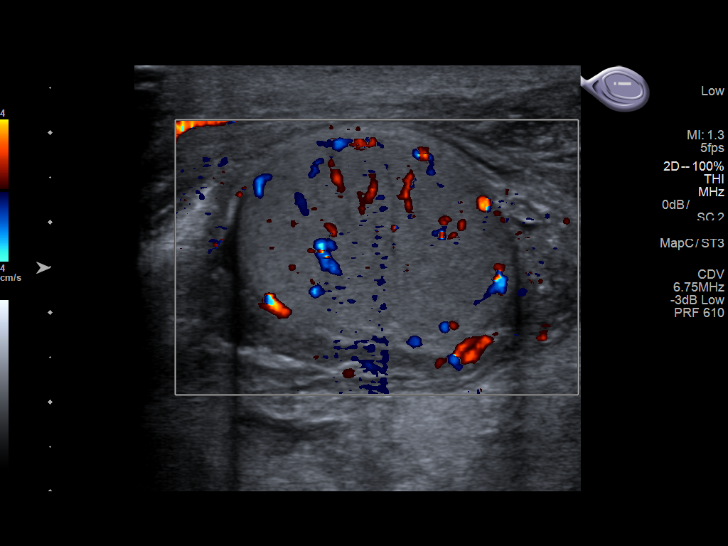
[im 28/37]
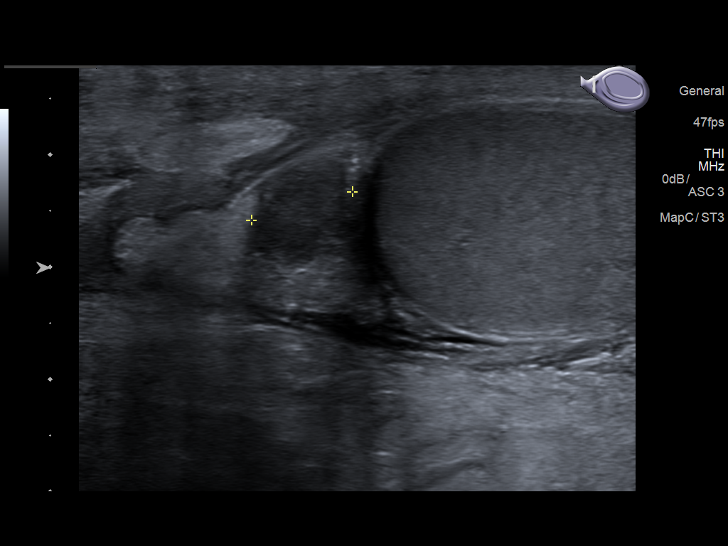
[im 31/37]
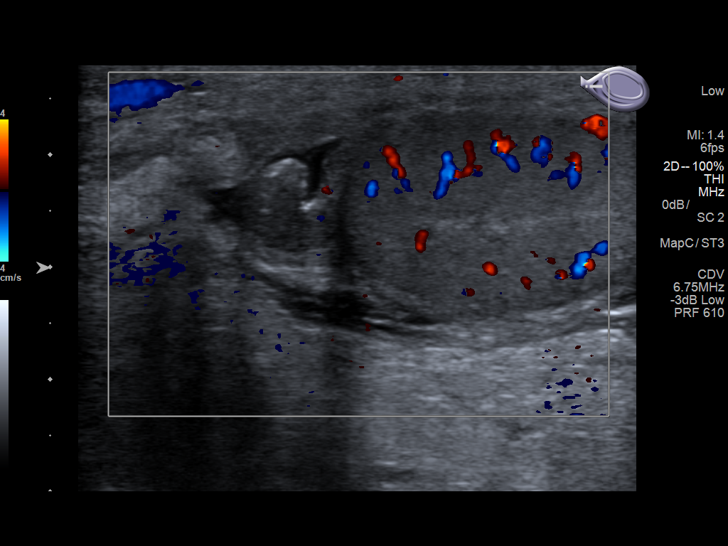
[im 34/37]
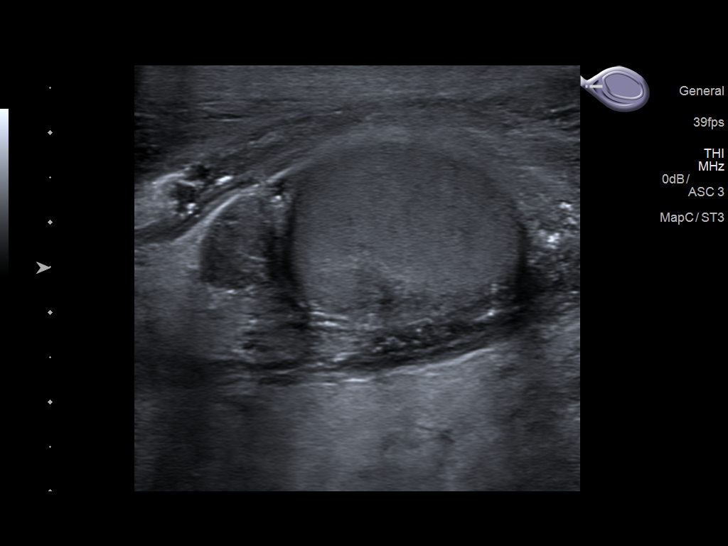
[im 37/37]
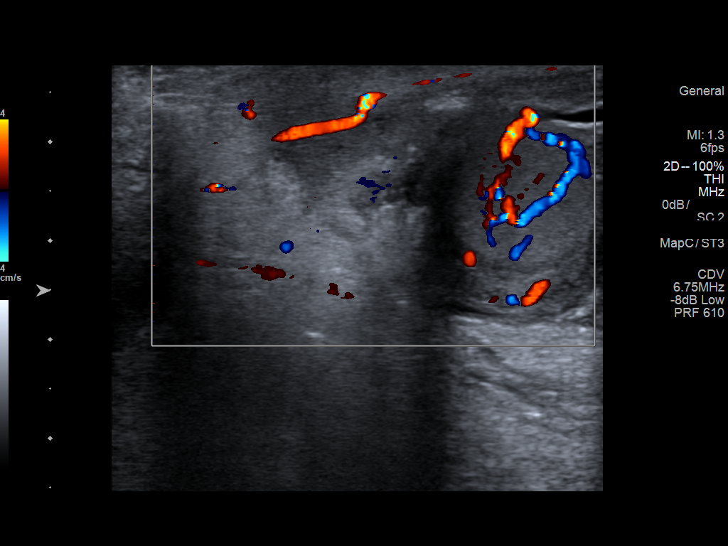

[14 of 25 positions shown; findings below may reference images not displayed]

FINDINGS: Right testicle

Measurements: 3.2 x 2.4 x 2.5 cm. No mass or microlithiasis
visualized. Normal/symmetric blood flow demonstrated within the
right testicle.

Left testicle

Measurements: 3.4 x 2.5 x 2.2 cm. No mass or microlithiasis
visualized. Normal/symmetric blood flow demonstrated within the left
testicle.

Right epididymis:  Normal in size and appearance.

Left epididymis:  Normal in size and appearance.

Hydrocele:  None visualized.

Varicocele:  None visualized.

There is pronounced skin thickening/edema about the scrotum.
IMPRESSION: 1. Pronounced skin thickening/edema about the scrotum. Recommend
clinical correlation and possibly a CT pelvis to exclude Fournier
gangrene.
2. Both testicles appear normal without focal mass or lesion. No
evidence of testicular torsion or orchitis.
3. No evidence of epididymitis.
